# Patient Record
Sex: Female | Born: 1990
Health system: Southern US, Community
[De-identification: ages and names within clinical notes are randomized; demographics above are authoritative.]

## PROBLEM LIST (undated history)

## (undated) ENCOUNTER — Inpatient Hospital Stay (HOSPITAL_COMMUNITY): Payer: Self-pay

## (undated) DIAGNOSIS — L989 Disorder of the skin and subcutaneous tissue, unspecified: Secondary | ICD-10-CM

## (undated) DIAGNOSIS — N9089 Other specified noninflammatory disorders of vulva and perineum: Secondary | ICD-10-CM

## (undated) DIAGNOSIS — Z789 Other specified health status: Secondary | ICD-10-CM

## (undated) DIAGNOSIS — O26649 Intrahepatic cholestasis of pregnancy, unspecified trimester: Secondary | ICD-10-CM

## (undated) DIAGNOSIS — D649 Anemia, unspecified: Secondary | ICD-10-CM

## (undated) DIAGNOSIS — F419 Anxiety disorder, unspecified: Secondary | ICD-10-CM

## (undated) DIAGNOSIS — Z3490 Encounter for supervision of normal pregnancy, unspecified, unspecified trimester: Secondary | ICD-10-CM

## (undated) DIAGNOSIS — O26619 Liver and biliary tract disorders in pregnancy, unspecified trimester: Secondary | ICD-10-CM

## (undated) DIAGNOSIS — M6208 Separation of muscle (nontraumatic), other site: Secondary | ICD-10-CM

## (undated) DIAGNOSIS — K831 Obstruction of bile duct: Secondary | ICD-10-CM

## (undated) DIAGNOSIS — N83209 Unspecified ovarian cyst, unspecified side: Secondary | ICD-10-CM

## (undated) HISTORY — DX: Obstruction of bile duct: K83.1

## (undated) HISTORY — DX: Anemia, unspecified: D64.9

## (undated) HISTORY — PX: WISDOM TOOTH EXTRACTION: SHX21

## (undated) HISTORY — PX: BREAST SURGERY: SHX581

## (undated) HISTORY — DX: Intrahepatic cholestasis of pregnancy, unspecified trimester: O26.649

## (undated) HISTORY — DX: Obstruction of bile duct: O26.619

## (undated) HISTORY — DX: Anxiety disorder, unspecified: F41.9

---
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
## 2013-09-20 NOTE — L&D Delivery Note (Signed)
Delivery Note At 8:24 PM a viable and healthy female was delivered via  (Presentation: ; Occiput Anterior).  APGAR: 9, 9; weight P.   Placenta status: Intact, Spontaneous.  Cord: 3 vessels with the following complications: None.    Anesthesia: Epidural  Episiotomy: None Lacerations: Labial Suture Repair: 3.0 vicryl rapide Est. Blood Loss (mL): 400  Mom to postpartum.  Baby to Couplet care / Skin to Skin.  Bovard-Stuckert, Byan Poplaski 04/19/2014, 9:06 PM  Br/AB+/Contra ?/RNI/Tdap

## 2013-09-23 ENCOUNTER — Encounter (HOSPITAL_COMMUNITY): Payer: Self-pay | Admitting: *Deleted

## 2013-09-23 ENCOUNTER — Inpatient Hospital Stay (HOSPITAL_COMMUNITY)
Admission: AD | Admit: 2013-09-23 | Discharge: 2013-09-23 | Disposition: A | Discharge status: Home / Self Care | Payer: 59 | Source: Ambulatory Visit | Attending: Obstetrics and Gynecology | Admitting: Obstetrics and Gynecology

## 2013-09-23 DIAGNOSIS — O210 Mild hyperemesis gravidarum: Secondary | ICD-10-CM | POA: Insufficient documentation

## 2013-09-23 DIAGNOSIS — O219 Vomiting of pregnancy, unspecified: Secondary | ICD-10-CM

## 2013-09-23 LAB — URINE MICROSCOPIC-ADD ON

## 2013-09-23 LAB — URINALYSIS, ROUTINE W REFLEX MICROSCOPIC
Glucose, UA: NEGATIVE mg/dL
Ketones, ur: >80 mg/dL — AB
Leukocytes, UA: NEGATIVE
Nitrite: NEGATIVE
Protein, ur: 30 mg/dL — AB
Specific Gravity, Urine: >1.03 — ABNORMAL HIGH (ref 1.005–1.030)
Urobilinogen, UA: 0.2 mg/dL (ref 0.0–1.0)
pH: 6 (ref 5.0–8.0)

## 2013-09-23 LAB — POCT PREGNANCY, URINE: Preg Test, Ur: POSITIVE — AB

## 2013-09-23 MED ORDER — DOCUSATE SODIUM 100 MG PO CAPS: 100.0000 mg | ORAL_CAPSULE | Freq: Every day | ORAL | Status: DC | PRN

## 2013-09-23 MED ORDER — LACTATED RINGERS IV BOLUS (SEPSIS): 1000.0000 mL | Freq: Once | INTRAVENOUS | Status: DC

## 2013-09-23 MED ORDER — PROMETHAZINE HCL 25 MG RE SUPP: 25.0000 mg | Freq: Four times a day (QID) | RECTAL | Status: DC | PRN

## 2013-09-23 MED ORDER — ONDANSETRON 8 MG/NS 50 ML IVPB
8.0000 mg | Freq: Once | INTRAVENOUS | Status: AC
  Administered 2013-09-23: 8 mg via INTRAVENOUS
  Filled 2013-09-23: qty 8

## 2013-09-23 MED ORDER — DEXTROSE IN LACTATED RINGERS 5 % IV SOLN
25.0000 mg | Freq: Once | INTRAVENOUS | Status: AC
  Administered 2013-09-23: 25 mg via INTRAVENOUS
  Filled 2013-09-23: qty 1

## 2013-09-23 MED ORDER — PROMETHAZINE HCL 25 MG PO TABS: 25.0000 mg | ORAL_TABLET | Freq: Four times a day (QID) | ORAL | Status: DC | PRN

## 2013-09-23 NOTE — MAU Provider Note (Signed)
Chief Complaint: Morning Sickness  First Provider Initiated Contact with Patient 09/23/13 1452     SUBJECTIVE HPI: Shari [name] is a 23 y.o. gravida 1 para 0 at 8 weeks and 3 days by last menstrual period who presents to maternity admissions reporting not being able to keep anything down x 3 days. Vomits any time she eats or drinks anything and also soon after bowel movements. Has had nausea and vomiting since the beginning of pregnancy. Symptoms were initially well controlled with Zofran, but then it stopped working. She was switched to Diclegis, but has had no relief of symptoms.  Prepregnancy weight 115 pounds per home scale.  History reviewed. No pertinent past medical history. OB History  Gravida Para Term Preterm AB SAB TAB Ectopic Multiple Living  1             # Outcome Date GA Lbr Len/2nd Weight Sex Delivery Anes PTL Lv  1 CUR              Past Surgical History  Procedure Laterality Date  . Wisdom tooth extraction     History   Social History  . Marital Status: Married    Spouse Name: N/A    Number of Children: N/A  . Years of Education: N/A   Occupational History  . Not on file.   Social History Main Topics  . Smoking status: Never Smoker   . Smokeless tobacco: Never Used  . Alcohol Use: No  . Drug Use: No  . Sexual Activity: Yes    Birth Control/ Protection: None   Other Topics Concern  . Not on file   Social History Narrative  . No narrative on file   No current facility-administered medications on file prior to encounter.   No current outpatient prescriptions on file prior to encounter.   No Known Allergies  ROS: Pertinent positive items in HPI. Negative for fever, chills, urinary complaints, flank pain, constipation, diarrhea, vaginal bleeding, abdominal pain, vaginal discharge or sick contacts. Last bowel movement 2 days ago, but has not eaten since then.  OBJECTIVE Blood pressure 125/85, pulse 85, temperature 98.3 F (36.8 C), resp. rate 16,  height 5\' 2"  (1.575 m), weight 47.809 kg (105 lb 6.4 oz), last menstrual period 07/26/2013. GENERAL: Well-developed, well-nourished female in no acute distress. Lethargic-appearing. HEENT: Normocephalic. Mucous membranes dry. HEART: normal rate RESP: normal effort ABDOMEN: Soft, non-tender EXTREMITIES: Nontender, no edema NEURO: Alert and oriented SPECULUM EXAM: Deferred  LAB RESULTS Results for orders placed during the hospital encounter of 09/23/13 (from the past 24 hour(s))  URINALYSIS, ROUTINE W REFLEX MICROSCOPIC     Status: Abnormal   Collection Time    09/23/13  2:20 PM      Result Value Range   Color, Urine YELLOW  YELLOW   APPearance HAZY (*) CLEAR   Specific Gravity, Urine >1.030 (*) 1.005 - 1.030   pH 6.0  5.0 - 8.0   Glucose, UA NEGATIVE  NEGATIVE mg/dL   Hgb urine dipstick TRACE (*) NEGATIVE   Bilirubin Urine SMALL (*) NEGATIVE   Ketones, ur >80 (*) NEGATIVE mg/dL   Protein, ur 30 (*) NEGATIVE mg/dL   Urobilinogen, UA 0.2  0.0 - 1.0 mg/dL   Nitrite NEGATIVE  NEGATIVE   Leukocytes, UA NEGATIVE  NEGATIVE  URINE MICROSCOPIC-ADD ON     Status: Abnormal   Collection Time    09/23/13  2:20 PM      Result Value Range   Squamous Epithelial / LPF MANY (*)  RARE   WBC, UA 3-6  <3 WBC/hpf   RBC / HPF 3-6  <3 RBC/hpf   Bacteria, UA MANY (*) RARE  POCT PREGNANCY, URINE     Status: Abnormal   Collection Time    09/23/13  2:28 PM      Result Value Range   Preg Test, Ur POSITIVE (*) NEGATIVE    IMAGING No results found.  MAU COURSE Phenergan, D5LR bolus. Dr. Senaida Ores notified patient symptoms and Phenergan and fluid orders.  No further vomiting after Phenergan, but nausea continues. Drank small amount of ginger ale and felt worse. Zofran ordered.  Feeling much better after 3 L of fluid and medications. Able to keep down ginger ale and crackers.  ASSESSMENT 1. Nausea and vomiting in pregnancy prior to [redacted] weeks gestation    PLAN Discharge home in stable  condition. Advance diet slowly. Try taking Phenergan at night and Zofran during the day on schedule for now.     Follow-up Information   Follow up with [facility] On 10/05/2013. (As scheduled or as needed if symptoms worsen)    Contact information:   [address], [address] [place] [address] [phone]      Follow up with [facility] [place] [facility]. (As needed if symptoms worsen)    Contact information:   [address] [id number] [place] [address] [phone]       Medication List    STOP taking these medications       DICLEGIS PO      TAKE these medications       docusate sodium 100 MG capsule  Commonly known as:  COLACE  Take 1-2 capsules (100-200 mg total) by mouth daily as needed.     prenatal multivitamin Tabs tablet  Take 1 tablet by mouth daily at 12 noon.     promethazine 25 MG tablet  Commonly known as:  PHENERGAN  Take 1 tablet (25 mg total) by mouth every 6 (six) hours as needed for nausea or vomiting.     promethazine 25 MG suppository  Commonly known as:  PHENERGAN  Place 1 suppository (25 mg total) rectally every 6 (six) hours as needed for nausea or vomiting. May use vaginally.     ZOFRAN 8 MG tablet  Generic drug:  ondansetron  Take 1 tablet (8 mg total) by mouth every 8 (eight) hours as needed for nausea or vomiting.       [place], [place] 09/23/2013  7:34 PM

## 2013-09-23 NOTE — Discharge Instructions (Signed)
Hyperemesis Gravidarum °Hyperemesis gravidarum is a severe form of nausea and vomiting that happens during pregnancy. Hyperemesis is worse than morning sickness. It may cause a woman to have nausea or vomiting all day for many days. It may keep a woman from eating and drinking enough food and liquids. Hyperemesis usually occurs during the first half (the first 20 weeks) of pregnancy. It often goes away once a woman is in her second half of pregnancy. However, sometimes hyperemesis continues through an entire pregnancy.  °CAUSES  °The cause of this condition is not completely known but is thought to be due to changes in the body's hormones when pregnant. It could be the high level of the pregnancy hormone or an increase in estrogen in the body.  °SYMPTOMS  °· Severe nausea and vomiting. °· Nausea that does not go away. °· Vomiting that does not allow you to keep any food down. °· Weight loss and body fluid loss (dehydration). °· Having no desire to eat or not liking food you have previously enjoyed. °DIAGNOSIS  °Your caregiver may ask you about your symptoms. Your caregiver may also order blood tests and urine tests to make sure something else is not causing the problem.  °TREATMENT  °You may only need medicine to control the problem. If medicines do not control the nausea and vomiting, you will be treated in the hospital to prevent dehydration, acidosis, weight loss, and changes in the electrolytes in your body that may harm the unborn baby (fetus). You may need intravenous (IV) fluids.  °HOME CARE INSTRUCTIONS  °· Take all medicine as directed by your caregiver. °· Try eating a couple of dry crackers or toast in the morning before getting out of bed. °· Avoid foods and smells that upset your stomach. °· Avoid fatty and spicy foods. Eat 5 to 6 small meals a day. °· Do not drink when eating meals. Drink between meals. °· For snacks, eat high protein foods, such as cheese. Eat or suck on things that have ginger in  them. Ginger helps nausea. °· Avoid food preparation. The smell of food can spoil your appetite. °· Avoid iron pills and iron in your multivitamins until after 3 to 4 months of being pregnant. °SEEK MEDICAL CARE IF:  °· Your abdominal pain increases since the last time you saw your caregiver. °· You have a severe headache. °· You develop vision problems. °· You feel you are losing weight. °SEEK IMMEDIATE MEDICAL CARE IF:  °· You are unable to keep fluids down. °· You vomit blood. °· You have constant nausea and vomiting. °· You have a fever. °· You have excessive weakness, dizziness, fainting, or extreme thirst. °MAKE SURE YOU:  °· Understand these instructions. °· Will watch your condition. °· Will get help right away if you are not doing well or get worse. °Document Released: 09/06/2005 Document Revised: 11/29/2011 Document Reviewed: 12/07/2010 °ExitCare® Patient Information ©2014 [employer]. ° °Hyperemesis Gravidarum Diet °Hyperemesis gravidarum is a severe form of morning sickness. It is characterized by frequent and severe vomiting. It happens during the first trimester of pregnancy. It may be caused by the rapid hormone changes that happen during pregnancy. It is associated with a 5% weight loss of pre-pregnancy weight. The hyperemesis diet may be used to lessen symptoms of nausea and vomiting. °EATING GUIDELINES °· Eat 5 to 6 small meals daily instead of 3 large meals. °· Avoid foods with strong smells. °· Avoid drinking 30 minutes before and after meals. °· Avoid fried   or high-fat foods, such as butter and cream sauces. °· Starchy foods are usually well-tolerated, such as cereal, toast, bread, potatoes, pasta, rice, and pretzels. °· Eat crackers before you get out of bed in the morning. °· Avoid spicy foods. °· Ginger may help with nausea. Add ¼ tsp ginger to hot tea or choose ginger tea. °· Continue to take your prenatal vitamins as directed by your caregiver. °SAMPLE MEAL PLAN °Breakfast  °· ½ cup  oatmeal °· 1 slice toast °· 1 tsp heart-healthy margarine °· 1 tsp jelly °· 1 scrambled egg °Midmorning Snack  °· 1 cup low-fat yogurt °Lunch  °· Plain ham sandwich °· Carrot or celery sticks °· 1 small apple °· 3 graham crackers °Midafternoon Snack  °· Cheese and crackers °Dinner °· 4 oz pork tenderloin °· 1 small baked potato °· 1 tsp margarine °· ½ cup broccoli °· ½ cup grapes °Evening Snack °· 1 cup pudding °Document Released: 07/04/2007 Document Revised: 11/29/2011 Document Reviewed: 02/06/2013 °ExitCare® Patient Information ©2014 [employer]. °

## 2013-09-23 NOTE — MAU Note (Signed)
Pt presents with complaints of nausea and vomiting on and off since she was [redacted] weeks pregnant. She was given zofran and it worked for a little while and was changed to diclegis on new years eve and it isn's working. She states she is not able to keep anything down by mouth

## 2013-09-24 LAB — URINE CULTURE
Colony Count: NO GROWTH
Culture: NO GROWTH

## 2013-10-08 LAB — OB RESULTS CONSOLE RPR: RPR: NONREACTIVE

## 2013-10-08 LAB — OB RESULTS CONSOLE HIV ANTIBODY (ROUTINE TESTING): HIV: NONREACTIVE

## 2013-10-08 LAB — OB RESULTS CONSOLE ABO/RH: RH TYPE: POSITIVE

## 2013-10-08 LAB — OB RESULTS CONSOLE ANTIBODY SCREEN: Antibody Screen: NEGATIVE

## 2013-10-08 LAB — OB RESULTS CONSOLE GC/CHLAMYDIA
Chlamydia: NEGATIVE
Gonorrhea: NEGATIVE

## 2013-10-08 LAB — OB RESULTS CONSOLE RUBELLA ANTIBODY, IGM: Rubella: NON-IMMUNE/NOT IMMUNE

## 2013-10-08 LAB — OB RESULTS CONSOLE HEPATITIS B SURFACE ANTIGEN: Hepatitis B Surface Ag: NEGATIVE

## 2014-03-28 ENCOUNTER — Inpatient Hospital Stay (HOSPITAL_COMMUNITY)
Admission: AD | Admit: 2014-03-28 | Discharge: 2014-03-28 | Disposition: A | Discharge status: Home / Self Care | Payer: 59 | Source: Ambulatory Visit | Attending: Obstetrics and Gynecology | Admitting: Obstetrics and Gynecology

## 2014-03-28 ENCOUNTER — Encounter (HOSPITAL_COMMUNITY): Payer: Self-pay | Admitting: *Deleted

## 2014-03-28 DIAGNOSIS — O4700 False labor before 37 completed weeks of gestation, unspecified trimester: Secondary | ICD-10-CM | POA: Insufficient documentation

## 2014-03-28 DIAGNOSIS — O9989 Other specified diseases and conditions complicating pregnancy, childbirth and the puerperium: Principal | ICD-10-CM

## 2014-03-28 DIAGNOSIS — O99891 Other specified diseases and conditions complicating pregnancy: Secondary | ICD-10-CM | POA: Insufficient documentation

## 2014-03-28 HISTORY — DX: Other specified health status: Z78.9

## 2014-03-28 LAB — WET PREP, GENITAL
TRICH WET PREP: NONE SEEN
YEAST WET PREP: NONE SEEN

## 2014-03-28 LAB — AMNISURE RUPTURE OF MEMBRANE (ROM) NOT AT ARMC: Amnisure ROM: NEGATIVE

## 2014-03-28 NOTE — MAU Provider Note (Signed)
History     CSN: [id number]  Arrival date and time: 03/28/14 0507   First Provider Initiated Contact with Patient 03/28/14 [phone]      Chief Complaint  Patient presents with  . Rupture of Membranes   HPI  Shari [name] is a 23 y.o. G1P0 at [redacted]w[redacted]d who presents today with possible ROM. She states that around 0400 she woke up and noticed a small trickle of fluid after using the bathroom. She denies any complications with the pregnancy. She denies any VB and states that she does not feel any contractions at this time. She does report occasional cramping, but it is very infrequent and not painful.   Past Medical History  Diagnosis Date  . Medical history non-contributory     Past Surgical History  Procedure Laterality Date  . Wisdom tooth extraction      No family history on file.  History  Substance Use Topics  . Smoking status: Never Smoker   . Smokeless tobacco: Never Used  . Alcohol Use: No    Allergies: No Known Allergies  Prescriptions prior to admission  Medication Sig Dispense Refill  . Prenatal Vit-Fe Fumarate-FA (PRENATAL MULTIVITAMIN) TABS tablet Take 1 tablet by mouth daily at 12 noon.      . docusate sodium (COLACE) 100 MG capsule Take 1-2 capsules (100-200 mg total) by mouth daily as needed.  30 capsule  4  . ondansetron (ZOFRAN) 8 MG tablet Take 1 tablet (8 mg total) by mouth every 8 (eight) hours as needed for nausea or vomiting.  30 tablet  1  . promethazine (PHENERGAN) 25 MG suppository Place 1 suppository (25 mg total) rectally every 6 (six) hours as needed for nausea or vomiting. May use vaginally.  12 each  1  . promethazine (PHENERGAN) 25 MG tablet Take 1 tablet (25 mg total) by mouth every 6 (six) hours as needed for nausea or vomiting.  30 tablet  1    ROS Physical Exam   Last menstrual period 07/26/2013.  Physical Exam  Nursing note and vitals reviewed. Constitutional: She is oriented to person, place, and time. She appears well-developed and  well-nourished. No distress.  Cardiovascular: Normal rate.   Respiratory: Effort normal.  GI: Soft. There is no tenderness.  Genitourinary:   External: no lesion Vagina: small amount of white discharge. No pooling of fluid  Cervix: pink, smooth, closed/thick/high  Uterus: AGA    Neurological: She is alert and oriented to person, place, and time.  Skin: Skin is warm and dry.  Psychiatric: She has a normal mood and affect.   FHT 145, moderate with 15x15 accels, no decels Toco: q3-4 min contractions, patient states that she does not feel them.  MAU Course  Procedures  Results for orders placed during the hospital encounter of 03/28/14 (from the past 24 hour(s))  AMNISURE RUPTURE OF MEMBRANE (ROM)     Status: None   Collection Time    03/28/14  5:52 AM      Result Value Ref Range   Amnisure ROM NEGATIVE    WET PREP, GENITAL     Status: Abnormal   Collection Time    03/28/14  5:52 AM      Result Value Ref Range   Yeast Wet Prep HPF POC NONE SEEN  NONE SEEN   Trich, Wet Prep NONE SEEN  NONE SEEN   Clue Cells Wet Prep HPF POC FEW (*) NONE SEEN   WBC, Wet Prep HPF POC FEW (*) NONE SEEN  Assessment and Plan  Exam for ROM with ROM not found DC home Labor precautions Fetal kick counts Return to MAU as needed  FU with the office as planned      Tawnya CrookHogan, Heather Donovan 03/28/2014, 5:51 AM

## 2014-03-28 NOTE — MAU Note (Signed)
Pt reports gush of clear fluid at 0400, also having some pressure and some cramping

## 2014-04-18 ENCOUNTER — Encounter (HOSPITAL_COMMUNITY): Payer: Self-pay | Admitting: *Deleted

## 2014-04-18 ENCOUNTER — Encounter (HOSPITAL_COMMUNITY): Payer: Self-pay

## 2014-04-18 ENCOUNTER — Telehealth (HOSPITAL_COMMUNITY): Payer: Self-pay | Admitting: *Deleted

## 2014-04-18 DIAGNOSIS — Z3490 Encounter for supervision of normal pregnancy, unspecified, unspecified trimester: Secondary | ICD-10-CM

## 2014-04-18 HISTORY — DX: Encounter for supervision of normal pregnancy, unspecified, unspecified trimester: Z34.90

## 2014-04-18 LAB — OB RESULTS CONSOLE GBS: GBS: NEGATIVE

## 2014-04-18 NOTE — H&P (Signed)
Shari [name] is a 23 y.o. female G1P0 at 39+ for IOL given term status, favorable cervic, maternal discomfort.  Relatively uncomplicated PNC except dated by [facility] US and morning sickness.  +FM, no LOF, no VB, occ ctx.  D/W pt POC for IOL. Maternal Medical History:  Contractions: Frequency: irregular.    Fetal activity: Perceived fetal activity is normal.    Prenatal Complications - Diabetes: none.    OB History   Grav Para Term Preterm Abortions TAB SAB Ect Mult Living   1             G1 present No abn pap No STD  Past Medical History  Diagnosis Date  . Medical history non-contributory   . Normal pregnancy 04/18/2014   Past Surgical History  Procedure Laterality Date  . Wisdom tooth extraction     Family History: fHTN, CAD Social History:  reports that she has never smoked. She has never used smokeless tobacco. She reports that she does not drink alcohol or use illicit drugs.married RN Meds PNV All NKDA   Prenatal Transfer Tool  Maternal Diabetes: No Genetic Screening: Normal Maternal Ultrasounds/Referrals: Normal Fetal Ultrasounds or other Referrals:  None Maternal Substance Abuse:  No Significant Maternal Medications:  None Significant Maternal Lab Results:  Lab values include: Group B Strep negative Other Comments:  None  Review of Systems  Constitutional: Negative.   HENT: Negative.   Eyes: Negative.   Respiratory: Negative.   Cardiovascular: Negative.   Gastrointestinal: Negative.   Genitourinary: Negative.   Musculoskeletal: Positive for back pain.  Skin: Negative.   Neurological: Negative.   Psychiatric/Behavioral: Negative.       Last menstrual period 07/26/2013. Maternal Exam:  Abdomen: Fundal height is appropriate for gestation.   Estimated fetal weight is 7-8#.   Fetal presentation: vertex  Introitus: Normal vulva. Normal vagina.  Pelvis: adequate for delivery.   Cervix: Cervix evaluated by digital exam.     Physical Exam   Constitutional: She is oriented to person, place, and time. She appears well-developed and well-nourished.  HENT:  Head: Normocephalic and atraumatic.  Cardiovascular: Normal rate and regular rhythm.   Respiratory: Effort normal and breath sounds normal. No respiratory distress. She has no wheezes.  GI: Soft. Bowel sounds are normal. She exhibits no distension. There is no tenderness.  Musculoskeletal: Normal range of motion.  Neurological: She is alert and oriented to person, place, and time.  Skin: Skin is warm and dry.  Psychiatric: She has a normal mood and affect. Her behavior is normal.    Prenatal labs: ABO, Rh: AB/Positive/-- (01/19 0000) Antibody: Negative (01/19 0000) Rubella: Nonimmune (01/19 0000) RPR: Nonreactive (01/19 0000)  HBsAg: Negative (01/19 0000)  HIV: Non-reactive (01/19 0000)  GBS: Negative (07/30 0000)   Hgb 13.4/Ur Cx neg/GC neg/ Chl neg/CF neg/glucola 110/[facility] and AFP WNL/Plt 312K/ Tdap 01/29/14 Dated by [facility] US Nl NT Nl anat, post plac, female  Assessment/Plan: 23yo G1P0 at 39 wk for IOL gbbs neg - no prophylaxis AROM/pitocin Expect SVD Epidural PRN   Bovard-Stuckert, Jeffey Janssen 04/18/2014, 8:47 PM

## 2014-04-18 NOTE — Telephone Encounter (Signed)
Preadmission screen  

## 2014-04-19 ENCOUNTER — Inpatient Hospital Stay (HOSPITAL_COMMUNITY)
Admission: RE | Admit: 2014-04-19 | Discharge: 2014-04-21 | DRG: 775 | Disposition: A | Discharge status: Home / Self Care | Payer: 59 | Source: Ambulatory Visit | Attending: Obstetrics and Gynecology | Admitting: Obstetrics and Gynecology

## 2014-04-19 ENCOUNTER — Inpatient Hospital Stay (HOSPITAL_COMMUNITY): Payer: 59 | Admitting: Anesthesiology

## 2014-04-19 ENCOUNTER — Encounter (HOSPITAL_COMMUNITY): Payer: 59 | Admitting: Anesthesiology

## 2014-04-19 ENCOUNTER — Encounter (HOSPITAL_COMMUNITY): Payer: Self-pay

## 2014-04-19 VITALS — BP 102/61 | HR 73 | Temp 98.0°F | Resp 18 | Ht 62.0 in | Wt 155.0 lb

## 2014-04-19 DIAGNOSIS — O9903 Anemia complicating the puerperium: Secondary | ICD-10-CM | POA: Diagnosis present

## 2014-04-19 DIAGNOSIS — O219 Vomiting of pregnancy, unspecified: Secondary | ICD-10-CM

## 2014-04-19 DIAGNOSIS — D500 Iron deficiency anemia secondary to blood loss (chronic): Secondary | ICD-10-CM | POA: Diagnosis present

## 2014-04-19 DIAGNOSIS — Z3490 Encounter for supervision of normal pregnancy, unspecified, unspecified trimester: Secondary | ICD-10-CM

## 2014-04-19 DIAGNOSIS — Z3493 Encounter for supervision of normal pregnancy, unspecified, third trimester: Secondary | ICD-10-CM

## 2014-04-19 DIAGNOSIS — Z3400 Encounter for supervision of normal first pregnancy, unspecified trimester: Secondary | ICD-10-CM

## 2014-04-19 DIAGNOSIS — Z8249 Family history of ischemic heart disease and other diseases of the circulatory system: Secondary | ICD-10-CM

## 2014-04-19 HISTORY — DX: Encounter for supervision of normal pregnancy, unspecified, unspecified trimester: Z34.90

## 2014-04-19 LAB — CBC
HCT: 30.9 % — ABNORMAL LOW (ref 36.0–46.0)
Hemoglobin: 10 g/dL — ABNORMAL LOW (ref 12.0–15.0)
MCH: 29.5 pg (ref 26.0–34.0)
MCHC: 32.4 g/dL (ref 30.0–36.0)
MCV: 91.2 fL (ref 78.0–100.0)
PLATELETS: 238 10*3/uL (ref 150–400)
RBC: 3.39 MIL/uL — ABNORMAL LOW (ref 3.87–5.11)
RDW: 14.6 % (ref 11.5–15.5)
WBC: 8.7 10*3/uL (ref 4.0–10.5)

## 2014-04-19 LAB — RPR

## 2014-04-19 MED ORDER — ZOLPIDEM TARTRATE 5 MG PO TABS: 5.0000 mg | ORAL_TABLET | Freq: Every evening | ORAL | Status: DC | PRN

## 2014-04-19 MED ORDER — EPHEDRINE 5 MG/ML INJ
10.0000 mg | INTRAVENOUS | Status: DC | PRN
  Filled 2014-04-19: qty 2

## 2014-04-19 MED ORDER — OXYTOCIN 40 UNITS IN LACTATED RINGERS INFUSION - SIMPLE MED: 1.0000 m[IU]/min | INTRAVENOUS | Status: DC

## 2014-04-19 MED ORDER — BENZOCAINE-MENTHOL 20-0.5 % EX AERO
1.0000 "application " | INHALATION_SPRAY | CUTANEOUS | Status: DC | PRN
  Administered 2014-04-20: 1 via TOPICAL
  Filled 2014-04-19: qty 56

## 2014-04-19 MED ORDER — LIDOCAINE HCL (PF) 1 % IJ SOLN
INTRAMUSCULAR | Status: DC | PRN
  Administered 2014-04-19 (×2): 5 mL

## 2014-04-19 MED ORDER — FENTANYL 2.5 MCG/ML BUPIVACAINE 1/10 % EPIDURAL INFUSION (WH - ANES)
14.0000 mL/h | INTRAMUSCULAR | Status: DC | PRN
  Administered 2014-04-19 (×3): 14 mL/h via EPIDURAL
  Filled 2014-04-19 (×3): qty 125

## 2014-04-19 MED ORDER — LACTATED RINGERS IV SOLN
INTRAVENOUS | Status: DC
  Administered 2014-04-19: 20:00:00 via INTRAVENOUS

## 2014-04-19 MED ORDER — DIPHENHYDRAMINE HCL 25 MG PO CAPS: 25.0000 mg | ORAL_CAPSULE | Freq: Four times a day (QID) | ORAL | Status: DC | PRN

## 2014-04-19 MED ORDER — BUTORPHANOL TARTRATE 1 MG/ML IJ SOLN: 1.0000 mg | INTRAMUSCULAR | Status: DC | PRN

## 2014-04-19 MED ORDER — LANOLIN HYDROUS EX OINT
TOPICAL_OINTMENT | CUTANEOUS | Status: DC | PRN
Start: 2014-04-19 — End: 2014-04-21

## 2014-04-19 MED ORDER — ONDANSETRON HCL 4 MG PO TABS: 4.0000 mg | ORAL_TABLET | ORAL | Status: DC | PRN

## 2014-04-19 MED ORDER — OXYTOCIN BOLUS FROM INFUSION
500.0000 mL | INTRAVENOUS | Status: DC
  Administered 2014-04-19: 500 mL via INTRAVENOUS

## 2014-04-19 MED ORDER — MEASLES, MUMPS & RUBELLA VAC ~~LOC~~ INJ
0.5000 mL | INJECTION | Freq: Once | SUBCUTANEOUS | Status: AC
  Administered 2014-04-20: 0.5 mL via SUBCUTANEOUS
  Filled 2014-04-19 (×2): qty 0.5

## 2014-04-19 MED ORDER — ONDANSETRON HCL 4 MG/2ML IJ SOLN
4.0000 mg | Freq: Four times a day (QID) | INTRAMUSCULAR | Status: DC | PRN
  Administered 2014-04-19: 4 mg via INTRAVENOUS
  Filled 2014-04-19: qty 2

## 2014-04-19 MED ORDER — WITCH HAZEL-GLYCERIN EX PADS: 1.0000 "application " | MEDICATED_PAD | CUTANEOUS | Status: DC | PRN

## 2014-04-19 MED ORDER — PHENYLEPHRINE 40 MCG/ML (10ML) SYRINGE FOR IV PUSH (FOR BLOOD PRESSURE SUPPORT)
80.0000 ug | PREFILLED_SYRINGE | INTRAVENOUS | Status: DC | PRN
  Filled 2014-04-19: qty 2

## 2014-04-19 MED ORDER — LACTATED RINGERS IV SOLN: 500.0000 mL | INTRAVENOUS | Status: DC | PRN

## 2014-04-19 MED ORDER — SENNOSIDES-DOCUSATE SODIUM 8.6-50 MG PO TABS
2.0000 | ORAL_TABLET | ORAL | Status: DC
  Administered 2014-04-20 – 2014-04-21 (×2): 2 via ORAL
  Filled 2014-04-19 (×2): qty 2

## 2014-04-19 MED ORDER — SIMETHICONE 80 MG PO CHEW: 80.0000 mg | CHEWABLE_TABLET | ORAL | Status: DC | PRN

## 2014-04-19 MED ORDER — OXYTOCIN 40 UNITS IN LACTATED RINGERS INFUSION - SIMPLE MED: 62.5000 mL/h | INTRAVENOUS | Status: DC

## 2014-04-19 MED ORDER — ONDANSETRON HCL 4 MG/2ML IJ SOLN: 4.0000 mg | INTRAMUSCULAR | Status: DC | PRN

## 2014-04-19 MED ORDER — TERBUTALINE SULFATE 1 MG/ML IJ SOLN: 0.2500 mg | Freq: Once | INTRAMUSCULAR | Status: DC | PRN

## 2014-04-19 MED ORDER — ACETAMINOPHEN 325 MG PO TABS: 650.0000 mg | ORAL_TABLET | ORAL | Status: DC | PRN

## 2014-04-19 MED ORDER — CITRIC ACID-SODIUM CITRATE 334-500 MG/5ML PO SOLN: 30.0000 mL | ORAL | Status: DC | PRN

## 2014-04-19 MED ORDER — LIDOCAINE HCL (PF) 1 % IJ SOLN
30.0000 mL | INTRAMUSCULAR | Status: DC | PRN
  Filled 2014-04-19 (×2): qty 30

## 2014-04-19 MED ORDER — IBUPROFEN 600 MG PO TABS: 600.0000 mg | ORAL_TABLET | Freq: Four times a day (QID) | ORAL | Status: DC | PRN

## 2014-04-19 MED ORDER — PHENYLEPHRINE 40 MCG/ML (10ML) SYRINGE FOR IV PUSH (FOR BLOOD PRESSURE SUPPORT)
80.0000 ug | PREFILLED_SYRINGE | INTRAVENOUS | Status: DC | PRN
  Filled 2014-04-19: qty 10
  Filled 2014-04-19: qty 2

## 2014-04-19 MED ORDER — OXYTOCIN 40 UNITS IN LACTATED RINGERS INFUSION - SIMPLE MED
1.0000 m[IU]/min | INTRAVENOUS | Status: DC
  Administered 2014-04-19: 2 m[IU]/min via INTRAVENOUS
  Filled 2014-04-19: qty 1000

## 2014-04-19 MED ORDER — LACTATED RINGERS IV SOLN: INTRAVENOUS | Status: DC

## 2014-04-19 MED ORDER — DIBUCAINE 1 % RE OINT
1.0000 | TOPICAL_OINTMENT | RECTAL | Status: DC | PRN
Start: 2014-04-19 — End: 2014-04-21

## 2014-04-19 MED ORDER — IBUPROFEN 600 MG PO TABS
600.0000 mg | ORAL_TABLET | Freq: Four times a day (QID) | ORAL | Status: DC
  Administered 2014-04-20 – 2014-04-21 (×5): 600 mg via ORAL
  Filled 2014-04-19 (×5): qty 1

## 2014-04-19 MED ORDER — DIPHENHYDRAMINE HCL 50 MG/ML IJ SOLN: 12.5000 mg | INTRAMUSCULAR | Status: DC | PRN

## 2014-04-19 MED ORDER — OXYCODONE-ACETAMINOPHEN 5-325 MG PO TABS: 1.0000 | ORAL_TABLET | ORAL | Status: DC | PRN

## 2014-04-19 MED ORDER — PRENATAL MULTIVITAMIN CH: 1.0000 | ORAL_TABLET | Freq: Every day | ORAL | Status: DC

## 2014-04-19 MED ORDER — LACTATED RINGERS IV SOLN: 500.0000 mL | Freq: Once | INTRAVENOUS | Status: DC

## 2014-04-19 NOTE — Progress Notes (Signed)
Patient ID: Shari [name], female   DOB: 01/28/1991, 23 y.o.   MRN: [id number]  No c/o's  AFVSS gen NAD FHTs 125-130's, good var, category 1 toco q1-652min  SVE 7.8/90/0-+1  23yo G1P0 in active labor Expect SVD

## 2014-04-19 NOTE — Progress Notes (Signed)
Patient ID: Shari [name], female   DOB: 07/17/1991, 23 y.o.   MRN: [id number]  Some pressure  AFVSS gen NAD  FHTs 120-130's, category 1 toco q1-2  SVE 7/100/+2  G1P0 for IOL - expect SVD soon

## 2014-04-19 NOTE — Progress Notes (Signed)
Patient ID: Shari [name], female   DOB: 03/22/1991, 23 y.o.   MRN: [id number] 23yo G1P0 at 39+ for IOL  Some cramping  AFVSS gen NAD FHTs 140's, good var, category 1 toco Q 1-3  AROM for clear fluid, w/o diff/compl.   SVE 4.5/50/-1/2  Cont IOL

## 2014-04-19 NOTE — Anesthesia Procedure Notes (Signed)
Epidural Patient location during procedure: OB Start time: 04/19/2014 9:36 AM  Staffing Anesthesiologist: Brayton CavesJACKSON, Kaelah Hayashi Performed by: anesthesiologist   Preanesthetic Checklist Completed: patient identified, site marked, surgical consent, pre-op evaluation, timeout performed, IV checked, risks and benefits discussed and monitors and equipment checked  Epidural Patient position: sitting Prep: site prepped and draped and DuraPrep Patient monitoring: continuous pulse ox and blood pressure Approach: midline Location: L3-L4 Injection technique: LOR air  Needle:  Needle type: Tuohy  Needle gauge: 17 G Needle length: 9 cm and 9 Needle insertion depth: 5 cm cm Catheter type: closed end flexible Catheter size: 19 Gauge Catheter at skin depth: 10 cm Test dose: negative  Assessment Events: blood not aspirated, injection not painful, no injection resistance, negative IV test and no paresthesia  Additional Notes Patient identified.  Risk benefits discussed including failed block, incomplete pain control, headache, nerve damage, paralysis, blood pressure changes, nausea, vomiting, reactions to medication both toxic or allergic, and postpartum back pain.  Patient expressed understanding and wished to proceed.  All questions were answered.  Sterile technique used throughout procedure and epidural site dressed with sterile barrier dressing. No paresthesia or other complications noted.The patient did not experience any signs of intravascular injection such as tinnitus or metallic taste in mouth nor signs of intrathecal spread such as rapid motor block. Please see nursing notes for vital signs.

## 2014-04-19 NOTE — Anesthesia Preprocedure Evaluation (Signed)

## 2014-04-20 ENCOUNTER — Encounter (HOSPITAL_COMMUNITY): Payer: Self-pay

## 2014-04-20 LAB — CBC
HEMATOCRIT: 23.3 % — AB (ref 36.0–46.0)
HEMOGLOBIN: 7.6 g/dL — AB (ref 12.0–15.0)
MCH: 29.6 pg (ref 26.0–34.0)
MCHC: 32.6 g/dL (ref 30.0–36.0)
MCV: 90.7 fL (ref 78.0–100.0)
Platelets: 197 10*3/uL (ref 150–400)
RBC: 2.57 MIL/uL — ABNORMAL LOW (ref 3.87–5.11)
RDW: 14.6 % (ref 11.5–15.5)
WBC: 16 10*3/uL — ABNORMAL HIGH (ref 4.0–10.5)

## 2014-04-20 NOTE — Lactation Note (Addendum)
This note was copied from the chart of Shari Arnold Longmily Arizpe. Lactation Consultation Note  Patient Name: Shari [name] ZOXWR'UToday's Date: 04/20/2014 Reason for consult: Initial assessment Baby 15 hours of life. Mom has been hand expressing and FOB has fed EBM with spoon. Assisted mom to latch baby to right breast, nipple is inverted, but baby able to evert nipple while suckling. Mom reports mild discomfort when nipple everts. Gave mom comfort gels with instruction. Baby is spitty and had two big burps with fluid in mouth that interrupted nursing. Enc mom to hand express colostrum to entice baby to open wide. Baby nursed well, suckling with a deep latch in the football position for a total of 10 minutes. Baby had to be re-latched due to spitty burps. Enc mom to offer lots of STS and feed with cues. Discussed employee pumps and how to pick up in hospital store. [facility], aware of OP/BFSG and community resources. Enc mom to call out for assistance as needed. Discussed assessment and feeding with patient's Women's RN Kyra.  Maternal Data Has patient been taught Hand Expression?: Yes Does the patient have breastfeeding experience prior to this delivery?: No  Feeding Feeding Type: Breast Fed Length of feed: 10 min  LATCH Score/Interventions Latch: Repeated attempts needed to sustain latch, nipple held in mouth throughout feeding, stimulation needed to elicit sucking reflex.  Audible Swallowing: A few with stimulation Intervention(s): Skin to skin;Hand expression  Type of Nipple: Inverted (right nipple inverted, but baby everts well while suckling.) Intervention(s): No intervention needed  Comfort (Breast/Nipple): Filling, red/small blisters or bruises, mild/mod discomfort  Problem noted: Mild/Moderate discomfort  Hold (Positioning): Assistance needed to correctly position infant at breast and maintain latch. Intervention(s): Breastfeeding basics reviewed;Support Pillows  LATCH  Score: 4  Lactation Tools Discussed/Used     Consult Status Consult Status: Follow-up Date: 04/21/14 Follow-up type: In-patient    Geralynn OchsWILLIARD, Roshelle Traub 04/20/2014, 12:10 PM

## 2014-04-20 NOTE — Progress Notes (Signed)
Post Partum Day 1 Subjective: no complaints, up ad lib, voiding, tolerating PO and nl lochia, pain controlled  Objective: Blood pressure 117/61, pulse 90, temperature 98.8 F (37.1 C), temperature source Oral, resp. rate 18, height 5\' 2"  (1.575 m), weight 70.308 kg (155 lb), last menstrual period 07/26/2013, SpO2 99.00%, unknown if currently breastfeeding.  Physical Exam:  General: alert and no distress Lochia: appropriate Uterine Fundus: firm   Recent Labs  04/19/14 0734 04/20/14 0545  HGB 10.0* 7.6*  HCT 30.9* 23.3*    Assessment/Plan: Plan for discharge tomorrow, Breastfeeding and Lactation consult.  Routine care.     LOS: 1 day   Bovard-Stuckert, Tarrence Enck 04/20/2014, 7:52 AM

## 2014-04-20 NOTE — Plan of Care (Signed)
Problem: Phase I Progression Outcomes Goal: Pain controlled with appropriate interventions Outcome: Completed/Met Date Met:  04/20/14 Has not needed pain medication up to this point,but did get ATC Motrin. Goal: Voiding adequately Outcome: Completed/Met Date Met:  04/20/14 Voided large amt of clear yellow urine. Goal: OOB as tolerated unless otherwise ordered Outcome: Completed/Met Date Met:  04/20/14 Walked to bathroom with assist and tolerated well. Goal: Initial discharge plan identified Outcome: Completed/Met Date Met:  04/20/14 VSS Vaginal bleeding WNL Pain controlled Understands self care and MD follow up

## 2014-04-21 ENCOUNTER — Ambulatory Visit: Payer: Self-pay

## 2014-04-21 LAB — CBC
HCT: 20.1 % — ABNORMAL LOW (ref 36.0–46.0)
Hemoglobin: 6.5 g/dL — CL (ref 12.0–15.0)
MCH: 30 pg (ref 26.0–34.0)
MCHC: 32.3 g/dL (ref 30.0–36.0)
MCV: 92.6 fL (ref 78.0–100.0)
PLATELETS: 182 10*3/uL (ref 150–400)
RBC: 2.17 MIL/uL — ABNORMAL LOW (ref 3.87–5.11)
RDW: 14.7 % (ref 11.5–15.5)
WBC: 13 10*3/uL — ABNORMAL HIGH (ref 4.0–10.5)

## 2014-04-21 MED ORDER — IBUPROFEN 800 MG PO TABS: 800.0000 mg | ORAL_TABLET | Freq: Three times a day (TID) | ORAL | Status: DC | PRN

## 2014-04-21 MED ORDER — POLYETHYLENE GLYCOL 3350 17 G PO PACK: 17.0000 g | PACK | Freq: Every day | ORAL | Status: DC

## 2014-04-21 MED ORDER — PRENATAL MULTIVITAMIN CH: 1.0000 | ORAL_TABLET | Freq: Every day | ORAL | Status: DC

## 2014-04-21 MED ORDER — FERROUS SULFATE 325 (65 FE) MG PO TABS: 325.0000 mg | ORAL_TABLET | Freq: Two times a day (BID) | ORAL | Status: DC

## 2014-04-21 MED ORDER — FERROUS SULFATE 325 (65 FE) MG PO TABS: 325.0000 mg | ORAL_TABLET | Freq: Every day | ORAL | Status: DC

## 2014-04-21 MED ORDER — OXYCODONE-ACETAMINOPHEN 5-325 MG PO TABS: 1.0000 | ORAL_TABLET | Freq: Four times a day (QID) | ORAL | Status: DC | PRN

## 2014-04-21 NOTE — Progress Notes (Signed)
Post Partum Day 2 Subjective: no complaints, up ad lib, voiding, tolerating PO and nl lochia, pain controlled.  Pt asymptomatic with blood loss anemia.  Recc iron  Objective: Blood pressure 102/61, pulse 73, temperature 98 F (36.7 C), temperature source Oral, resp. rate 18, height 5\' 2"  (1.575 m), weight 70.308 kg (155 lb), last menstrual period 07/26/2013, SpO2 99.00%, unknown if currently breastfeeding.  Physical Exam:  General: alert and no distress Lochia: appropriate Uterine Fundus: firm   Recent Labs  04/20/14 0545 04/21/14 0525  HGB 7.6* 6.5*  HCT 23.3* 20.1*    Assessment/Plan: Discharge home, Breastfeeding and Lactation consult.  Routine care.  D/C with motrin, percocet and PNV.  F/u 6 weeks.     LOS: 2 days   Bovard-Stuckert, Kanyon Seibold 04/21/2014, 8:57 AM

## 2014-04-21 NOTE — Progress Notes (Signed)
Discharge instructions provided to patient and significant other at bedside.  Medications, activity, follow up appointments, when to call the doctor and community resources discussed.  No questions at this time.  Patient left unit in stable condition with all personal belongings and prescriptions accompanied by staff.  K. Jeanpaul Biehl, RN------ 

## 2014-04-21 NOTE — Lactation Note (Signed)
This note was copied from the chart of Girl Arnold Longmily Bashore. Lactation Consultation Note Mom request help with breastfeeding due to the right nipple being inverted. Nipple is everted and baby Zoe can easily latch; there is a line of inversion in the center of the nipple which is causing pain.  Nipple shield initiated; Zoe latched easily to NS; mom states much more comfortable. Mom will use NS on the right side but not the left, as the left is everted and pain-free.   Hand pump provided, reviewed its use and cleaning; mom will get her DEBP from [facility] on Tuesday.    Mom was encouraged to call lactation department if she has any questions or concerns and to attend the breastfeeding support group.   Patient Name: Girl Arnold Longmily Hane VHQIO'NToday's Date: 04/21/2014 Reason for consult: Follow-up assessment   Maternal Data    Feeding Feeding Type: Breast Fed Length of feed: 10 min  LATCH Score/Interventions Latch: Grasps breast easily, tongue down, lips flanged, rhythmical sucking.  Audible Swallowing: Spontaneous and intermittent  Type of Nipple: Everted at rest and after stimulation  Comfort (Breast/Nipple): Filling, red/small blisters or bruises, mild/mod discomfort  Problem noted: Mild/Moderate discomfort  Hold (Positioning): No assistance needed to correctly position infant at breast.  LATCH Score: 9  Lactation Tools Discussed/Used     Consult Status Consult Status: Complete    Lenard ForthSanders, Orion Vandervort Fulmer 04/21/2014, 11:38 AM

## 2014-04-21 NOTE — Discharge Summary (Signed)
Obstetric Discharge Summary Reason for Admission: onset of labor Prenatal Procedures: none Intrapartum Procedures: spontaneous vaginal delivery Postpartum Procedures: none Complications-Operative and Postpartum: 2nd degree perineal laceration Hemoglobin  Date Value Ref Range Status  04/21/2014 6.5* 12.0 - 15.0 g/dL Final     REPEATED TO VERIFY     CRITICAL RESULT CALLED TO, READ BACK BY AND VERIFIED WITH:     SPOKE TO SANDERSK @ 0601 ON [id number] BY BOSTONC     HCT  Date Value Ref Range Status  04/21/2014 20.1* 36.0 - 46.0 % Final    Physical Exam:  General: alert and no distress Lochia: appropriate Uterine Fundus: firm  Discharge Diagnoses: Term Pregnancy-delivered  Discharge Information: Date: 04/21/2014 Activity: pelvic rest Diet: routine Medications: PNV, Ibuprofen, Iron and Percocet Condition: stable Instructions: refer to practice specific booklet Discharge to: home Follow-up Information   Follow up with Shari [name], Shari Malta, MD. Schedule an appointment as soon as possible for a visit in 6 weeks. (for postpartum check)    Specialty:  Obstetrics and Gynecology   Contact information:   [address] [place] [id number] [phone]       Newborn Data: Live born female  Birth Weight: 8 lb (3630 g) APGAR: 9, 9  Home with mother.  Shari [name], Shari [name] 04/21/2014, 9:11 AM

## 2014-04-21 NOTE — Anesthesia Postprocedure Evaluation (Signed)
Anesthesia Post Note  Patient: Shari [name]  Procedure(s) Performed: * No procedures listed *  Anesthesia type: Epidural  Patient location: Mother/Baby  Post pain: Pain level controlled  Post assessment: Post-op Vital signs reviewed  Last Vitals:  Filed Vitals:   04/21/14 0536  BP: 102/61  Pulse: 73  Temp: 36.7 C  Resp: 18    Post vital signs: Reviewed  Level of consciousness:alert  Complications: No apparent anesthesia complications

## 2014-04-23 ENCOUNTER — Encounter (HOSPITAL_COMMUNITY): Payer: Self-pay

## 2014-05-03 ENCOUNTER — Ambulatory Visit (HOSPITAL_COMMUNITY)
Admission: RE | Admit: 2014-05-03 | Discharge: 2014-05-03 | Disposition: A | Discharge status: Home / Self Care | Payer: 59 | Source: Ambulatory Visit | Attending: Obstetrics and Gynecology | Admitting: Obstetrics and Gynecology

## 2014-05-03 NOTE — Lactation Note (Signed)
Lactation Consult  Mother's reason for visit: Mother is complaining of a possible clogged duct. She denies having a fever, chills or flu like symptoms. She states that she does have a headache. Mother states the headache comes and goes and she has had it since hospital discharge. She states that Hem was 6.5 on discharge from the hospital. She had a drop from 10 piror to delivery.  Visit Type: feeding assessment  Appointment Notes:  Mother has a reddened area over the (L) breast that is 3-4 cm in size at 3 o clock. Assist mother with good breast massage. Infant was latched on the affected breast (L). Observed that infant drain breast well. Breast massage by LC. Area softened only a little. I had mother to phone OB while in the office. She was able to leave a message to get a call back for possible early mastitis. Mother was given guidelines to massage, breastfeed , post pump . She will follow up in one week for assessment.   Consult:  Initial Lactation Consultant:  Michel Bickers  ________________________________________________________________________  [name] Name: Shari [name]  Date of Birth: 04/19/2016  Pediatrician: Dr Eddie Candle Gender: female  Gestational Age: [redacted]w[redacted]d (At Birth)  Birth Weight: 8 lb (3630 g)  Weight at Discharge: Weight: 7 lb 10.2 oz (3464 g) Date of Discharge: 04/21/2014    [facility] Weights    04/19/14 2024 04/21/14 0100   Weight: 8 lb (3630 g) 7 lb 10.2 oz (3464 g)   Last weight taken from location [facility] of [facility] Weight today:8-2.3,3694            ________________________________________________________________________  Mother's Name: Shari [name] Type of delivery:  vaginal del  Breastfeeding Experience:  none Maternal Medical Conditions:  Post-partum hemorrhage and Gastric bypass Maternal Medications:  Iron supplement and Prenatal vit, Motrin  ________________________________________________________________________  Breastfeeding History  (Post Discharge)  Frequency of breastfeeding: every 2-3 hours Duration of feeding: 30-40 mins    Pumping  Type of pump:  Medela free style Frequency:  3 times Volume:  60 ml  Infant Intake and Output Assessment  Voids:  8 in 24 hrs.  Color:  Clear yellow Stools:  6in 24 hrs.  Color:  Yellow  ________________________________________________________________________  Maternal Breast Assessment  Breast:  Full Nipple:  Erect Pain level:  Pain scale of 4-5, area tender to touch Pain interventions:  Bra  _______________________________________________________________________  Infant's oral assessment:  WNL  Positioning:  Football Left breast  LATCH documentation:  Latch:  2 = Grasps breast easily, tongue down, lips flanged, rhythmical sucking.  Audible swallowing:  2 = Spontaneous and intermittent  Type of nipple:  2 = Everted at rest and after stimulation  Comfort (Breast/Nipple):  1 = Filling, red/small blisters or bruises, mild/mod discomfort  Hold (Positioning):  2 = No assistance needed to correctly position infant at breast  [facility] score:9  Attached assessment:  Deep  Lips flanged:  Yes.    Lips untucked:  Yes.    Suck assessment:  Nutritive   Pre-feed weight: 3694 g  (8 lb. 2.3 oz.) Post-feed weight: 3732 g (8 lb. 3.6 oz.) Amount transferred:  38ml   Infant's oral assessment:  WNL  Positioning:  Football Right breast  LATCH documentation:  Latch:  2 = Grasps breast easily, tongue down, lips flanged, rhythmical sucking.  Audible swallowing:  2 = Spontaneous and intermittent  Type of nipple:  2 = Everted at rest and after stimulation  Comfort (Breast/Nipple):  1 = Filling, red/small blisters or  bruises, mild/mod discomfort  Hold (Positioning):  2 = No assistance needed to correctly position infant at breast  LATCH score: 9  Attached assessment:  Deep  Lips flanged:  Yes.    Lips untucked:  Yes.     Suck assessment:  nutritive  Pre-feed weight:  3737 g  (8 lb. 3.7 oz.) Post-feed weight: 3764 g (8 lb.4.8 oz.) Amount transferred: 27 ml   Total amount transferred:  65 ml   Continue to cue base feed, wake infant to feed at 3 hours if not cuing Observed that infant opens wide with lips flanged Breast feed infant in football on (L) breast for massage  Advised mother to phone OB to get Rx for antibiotic Recommend that mother follow treatment plan for Mastitis --apply moist warm compresses over affected area every 2-3 hours --take Motrin --drain the breast well with active cue base feeding --do good breast massage over area -- take all prescribed antitibotic --nap frequently when Shari [name] naps -- drink to thirst  Continue to pump for 20 mins at least 2-3 times daily If breast become to full pump for comfort at least 5-10 mins Follow up in one week or see OB if not resolved appt scheduled for August 24 at 1p,m.

## 2014-05-13 ENCOUNTER — Ambulatory Visit (HOSPITAL_COMMUNITY)
Admission: RE | Admit: 2014-05-13 | Discharge: 2014-05-13 | Disposition: A | Discharge status: Home / Self Care | Payer: 59 | Source: Ambulatory Visit | Attending: Obstetrics and Gynecology | Admitting: Obstetrics and Gynecology

## 2014-05-13 NOTE — Lactation Note (Signed)
Lactation Consult  Mother's reason for visit:  Weight check on Shari [name] and f/u for mastitis Visit Type:outpatient lactation Appointment Notes:  Baby with slow weight gain at first, doing well now, mom with history plugged duct/mastitis, on last day of antibiotics Consult:  Follow-Up Lactation Consultant:  Alfred Levins  ________________________________________________________________________    Shari [name] Name: Shari [name]  Date of Birth: 04/19/2016  Pediatrician: Dr. Loraine Leriche cummings  Gender: female  Gestational Age: [redacted]w[redacted]d (At Birth)  Birth Weight: 8 lb (3630 g)  Weight at Discharge: Weight: 7 lb 10.2 oz (3464 g) Date of Discharge: 04/21/2014     [facility] Weights     04/19/14 2024 04/21/14 0100    Weight: 8 lb (3630 g) 7 lb 10.2 oz (3464 g)    [facility]: 8/17 Dr. Eddie Candle  8 lbs 6 oz Location: Weight today: 8 lbs 15.6        ________________________________________________________________________  Mother's Name: Arnold Long Type of delivery:  vaginal Breastfeeding Experience:  First baby Maternal Medical Conditions:  None pertinent Maternal Medications:  Ibuprofen 800 mg TID, dicloxacillin 500 mg TID, last dose this afternoon, iron PD, prenatal vitamins  ________________________________________________________________________  Breastfeeding History (Post Discharge)  Frequency of breastfeeding:  On demand, every 2-3 hours Duration of feeding:  30-60 minutes    Pumping  Type of pump:  medla free style Frequency:  Twice a day Volume:  Drops - 15  mls after breast feeding, 90 mls on full breastl  Infant Intake and Output Assessment  Voids:  6-8 in 24 hrs.  Color:  Clear yellow Stools:  4-6 in 24 hrs.  Color:  Yellow  ________________________________________________________________________  Maternal Breast Assessment  Breast:  Soft, Full and hard knot on left breast, at 2 o'clock Nipple:  Erect Pain level:  0 Pain  interventions:  none  _______________________________________________________________________ Feeding Assessment/Evaluation  Shari [name] now 75 weeks old, and is 1 pound over birth weight. Mom has been feeding her on demand, and she feeds every 2-3 hours. Shari [name] transferred 34 mls in two latches, and is acting hungry again now. She tends to get sleepy at the breast, and feeds on and off over an hour at times. Her weight gains is very good, 10 ounces in the last 7 days. Mom still has a hard knot in her left breast, but no evidence of infection. Mom takes her last antibiotic today, a 10 day course. Mom also has mild cracking in her right nipple, this nipple was inverted, and Shari [name] has everted it. I suggested mom call her MD, and see if she will order all purpose nipple cream on  this nipple. Mom is to feed on cue, pump left breast only if Shari [name] goes longer than 3 hours without feeding, and pump right breast only if uncomfortably full, and mom can not get Shari [name] to feed. Continue with  warm compresses and heat to left knot in breast.  Mom knows to call as needed.  Initial feeding assessment:  Infant's oral assessment:  WNL  Positioning:  Cradle Left breast  LATCH documentation:  Latch:  2 = Grasps breast easily, tongue down, lips flanged, rhythmical sucking.  Audible swallowing:  2 = Spontaneous and intermittent  Type of nipple:  2 = Everted at rest and after stimulation  Comfort (Breast/Nipple):  2 = Soft / non-tender  Hold (Positioning):  2 = No assistance needed to correctly position infant at breast  LATCH score:  10  Attached assessment:  Deep  Lips  flanged:  Yes.    Lips untucked:  Yes.    Suck assessment:  Nonnutritive  Tools:  none Instructed on use and cleaning of tool:  no  Pre-feed weight: 4070g  (8 lb. 15.6 oz.) Post-feed weight:4076 g (9 lb. 0.1 oz.) Amount transferred:  16 ml Amount supplemented: 0 ml  Additional Feeding Assessment -   Infant's oral assessment:  WNL  Positioning:   Cradle Left breast  LATCH documentation:  Latch:  2 = Grasps breast easily, tongue down, lips flanged, rhythmical sucking.  Audible swallowing:  2 = Spontaneous and intermittent  Type of nipple:  2 = Everted at rest and after stimulation  Comfort (Breast/Nipple):  2 = Soft / non-tender  Hold (Positioning):  2 = No assistance needed to correctly position infant at breast  LATCH score:  10  Attached assessment:  Deep  Lips flanged:  Yes.    Lips untucked:  Yes.    Suck assessment:  Nutritive  Tools:  none Instructed on use and cleaning of tool:  No.  Pre-feed weight:  4086 g  (9 lb. 0.1 oz.) Post-feed weight:  2104 g (9 lb. 0.7 oz.) Amount transferred:  18 ml Amount supplemented:  0 ml   Total amount pumped post feed:  R not fed from ml    L 34 ml - Mom had breast fed baby 2 hours prior to consult  Total amount transferred:  34 ml Total supplement given:  0 ml

## 2014-05-24 ENCOUNTER — Other Ambulatory Visit: Payer: Self-pay | Admitting: Obstetrics and Gynecology

## 2014-05-24 ENCOUNTER — Ambulatory Visit
Admission: RE | Admit: 2014-05-24 | Discharge: 2014-05-24 | Disposition: A | Discharge status: Home / Self Care | Payer: 59 | Source: Ambulatory Visit | Attending: Obstetrics and Gynecology | Admitting: Obstetrics and Gynecology

## 2014-05-24 DIAGNOSIS — N63 Unspecified lump in unspecified breast: Secondary | ICD-10-CM

## 2014-07-22 ENCOUNTER — Encounter (HOSPITAL_COMMUNITY): Payer: Self-pay

## 2014-10-21 ENCOUNTER — Other Ambulatory Visit: Payer: Self-pay | Admitting: Obstetrics and Gynecology

## 2014-10-21 DIAGNOSIS — N63 Unspecified lump in unspecified breast: Secondary | ICD-10-CM

## 2014-11-22 ENCOUNTER — Ambulatory Visit
Admission: RE | Admit: 2014-11-22 | Discharge: 2014-11-22 | Disposition: A | Discharge status: Home / Self Care | Payer: 59 | Source: Ambulatory Visit | Attending: Obstetrics and Gynecology | Admitting: Obstetrics and Gynecology

## 2014-11-22 ENCOUNTER — Other Ambulatory Visit: Payer: Self-pay | Admitting: Obstetrics and Gynecology

## 2014-11-22 DIAGNOSIS — N63 Unspecified lump in unspecified breast: Secondary | ICD-10-CM

## 2014-12-04 ENCOUNTER — Ambulatory Visit
Admission: RE | Admit: 2014-12-04 | Discharge: 2014-12-04 | Disposition: A | Discharge status: Home / Self Care | Payer: 59 | Source: Ambulatory Visit | Attending: Obstetrics and Gynecology | Admitting: Obstetrics and Gynecology

## 2014-12-04 DIAGNOSIS — N63 Unspecified lump in unspecified breast: Secondary | ICD-10-CM

## 2015-08-25 ENCOUNTER — Telehealth: Payer: Self-pay | Admitting: Family Medicine

## 2015-08-25 ENCOUNTER — Encounter: Payer: Self-pay | Admitting: Family Medicine

## 2015-08-25 ENCOUNTER — Ambulatory Visit (INDEPENDENT_AMBULATORY_CARE_PROVIDER_SITE_OTHER): Payer: 59 | Admitting: Family Medicine

## 2015-08-25 VITALS — BP 124/79 | HR 103 | Temp 98.2°F | Resp 20 | Ht 63.5 in | Wt 117.0 lb

## 2015-08-25 DIAGNOSIS — D649 Anemia, unspecified: Secondary | ICD-10-CM | POA: Diagnosis not present

## 2015-08-25 DIAGNOSIS — L989 Disorder of the skin and subcutaneous tissue, unspecified: Secondary | ICD-10-CM

## 2015-08-25 DIAGNOSIS — Z7689 Persons encountering health services in other specified circumstances: Secondary | ICD-10-CM

## 2015-08-25 DIAGNOSIS — Z0000 Encounter for general adult medical examination without abnormal findings: Secondary | ICD-10-CM | POA: Insufficient documentation

## 2015-08-25 DIAGNOSIS — Z7189 Other specified counseling: Secondary | ICD-10-CM

## 2015-08-25 LAB — CBC WITH DIFFERENTIAL/PLATELET
Basophils Absolute: 0 10*3/uL (ref 0.0–0.1)
Basophils Relative: 0.5 % (ref 0.0–3.0)
EOS PCT: 0.8 % (ref 0.0–5.0)
Eosinophils Absolute: 0 10*3/uL (ref 0.0–0.7)
HCT: 39.8 % (ref 36.0–46.0)
HEMOGLOBIN: 13.4 g/dL (ref 12.0–15.0)
LYMPHS ABS: 1.8 10*3/uL (ref 0.7–4.0)
Lymphocytes Relative: 34.8 % (ref 12.0–46.0)
MCHC: 33.6 g/dL (ref 30.0–36.0)
MCV: 91 fl (ref 78.0–100.0)
Monocytes Absolute: 0.4 10*3/uL (ref 0.1–1.0)
Monocytes Relative: 8.4 % (ref 3.0–12.0)
Neutro Abs: 2.8 10*3/uL (ref 1.4–7.7)
Neutrophils Relative %: 55.5 % (ref 43.0–77.0)
Platelets: 282 10*3/uL (ref 150.0–400.0)
RBC: 4.38 Mil/uL (ref 3.87–5.11)
RDW: 12.6 % (ref 11.5–15.5)
WBC: 5.1 10*3/uL (ref 4.0–10.5)

## 2015-08-25 NOTE — Telephone Encounter (Signed)
Please call pt: - her labs are normal and her hgb is 13.4.

## 2015-08-25 NOTE — Patient Instructions (Signed)
Health Maintenance, Female Adopting a healthy lifestyle and getting preventive care can go a long way to promote health and wellness. Talk with your health care provider about what schedule of regular examinations is right for you. This is a good chance for you to check in with your provider about disease prevention and staying healthy. In between checkups, there are plenty of things you can do on your own. Experts have done a lot of research about which lifestyle changes and preventive measures are most likely to keep you healthy. Ask your health care provider for more information. WEIGHT AND DIET  Eat a healthy diet  Be sure to include plenty of vegetables, fruits, low-fat dairy products, and lean protein.  Do not eat a lot of foods high in solid fats, added sugars, or salt.  Get regular exercise. This is one of the most important things you can do for your health.  Most adults should exercise for at least 150 minutes each week. The exercise should increase your heart rate and make you sweat (moderate-intensity exercise).  Most adults should also do strengthening exercises at least twice a week. This is in addition to the moderate-intensity exercise.  Maintain a healthy weight  Body mass index (BMI) is a measurement that can be used to identify possible weight problems. It estimates body fat based on height and weight. Your health care provider can help determine your BMI and help you achieve or maintain a healthy weight.  For females 20 years of age and older:   A BMI below 18.5 is considered underweight.  A BMI of 18.5 to 24.9 is normal.  A BMI of 25 to 29.9 is considered overweight.  A BMI of 30 and above is considered obese.  Watch levels of cholesterol and blood lipids  You should start having your blood tested for lipids and cholesterol at 26 years of age, then have this test every 5 years.  You may need to have your cholesterol levels checked more often if:  Your lipid  or cholesterol levels are high.  You are older than 26 years of age.  You are at high risk for heart disease.  CANCER SCREENING   Lung Cancer  Lung cancer screening is recommended for adults 55-80 years old who are at high risk for lung cancer because of a history of smoking.  A yearly low-dose CT scan of the lungs is recommended for people who:  Currently smoke.  Have quit within the past 15 years.  Have at least a 30-pack-year history of smoking. A pack year is smoking an average of one pack of cigarettes a day for 1 year.  Yearly screening should continue until it has been 15 years since you quit.  Yearly screening should stop if you develop a health problem that would prevent you from having lung cancer treatment.  Breast Cancer  Practice breast self-awareness. This means understanding how your breasts normally appear and feel.  It also means doing regular breast self-exams. Let your health care provider know about any changes, no matter how small.  If you are in your 20s or 30s, you should have a clinical breast exam (CBE) by a health care provider every 1-3 years as part of a regular health exam.  If you are 40 or older, have a CBE every year. Also consider having a breast X-ray (mammogram) every year.  If you have a family history of breast cancer, talk to your health care provider about genetic screening.  If you   are at high risk for breast cancer, talk to your health care provider about having an MRI and a mammogram every year.  Breast cancer gene (BRCA) assessment is recommended for women who have family members with BRCA-related cancers. BRCA-related cancers include:  Breast.  Ovarian.  Tubal.  Peritoneal cancers.  Results of the assessment will determine the need for genetic counseling and BRCA1 and BRCA2 testing. Cervical Cancer Your health care provider may recommend that you be screened regularly for cancer of the pelvic organs (ovaries, uterus, and  vagina). This screening involves a pelvic examination, including checking for microscopic changes to the surface of your cervix (Pap test). You may be encouraged to have this screening done every 3 years, beginning at age 21.  For women ages 30-65, health care providers may recommend pelvic exams and Pap testing every 3 years, or they may recommend the Pap and pelvic exam, combined with testing for human papilloma virus (HPV), every 5 years. Some types of HPV increase your risk of cervical cancer. Testing for HPV may also be done on women of any age with unclear Pap test results.  Other health care providers may not recommend any screening for nonpregnant women who are considered low risk for pelvic cancer and who do not have symptoms. Ask your health care provider if a screening pelvic exam is right for you.  If you have had past treatment for cervical cancer or a condition that could lead to cancer, you need Pap tests and screening for cancer for at least 20 years after your treatment. If Pap tests have been discontinued, your risk factors (such as having a new sexual partner) need to be reassessed to determine if screening should resume. Some women have medical problems that increase the chance of getting cervical cancer. In these cases, your health care provider may recommend more frequent screening and Pap tests. Colorectal Cancer  This type of cancer can be detected and often prevented.  Routine colorectal cancer screening usually begins at 26 years of age and continues through 26 years of age.  Your health care provider may recommend screening at an earlier age if you have risk factors for colon cancer.  Your health care provider may also recommend using home test kits to check for hidden blood in the stool.  A small camera at the end of a tube can be used to examine your colon directly (sigmoidoscopy or colonoscopy). This is done to check for the earliest forms of colorectal  cancer.  Routine screening usually begins at age 50.  Direct examination of the colon should be repeated every 5-10 years through 26 years of age. However, you may need to be screened more often if early forms of precancerous polyps or small growths are found. Skin Cancer  Check your skin from head to toe regularly.  Tell your health care provider about any new moles or changes in moles, especially if there is a change in a mole's shape or color.  Also tell your health care provider if you have a mole that is larger than the size of a pencil eraser.  Always use sunscreen. Apply sunscreen liberally and repeatedly throughout the day.  Protect yourself by wearing long sleeves, pants, a wide-brimmed hat, and sunglasses whenever you are outside. HEART DISEASE, DIABETES, AND HIGH BLOOD PRESSURE   High blood pressure causes heart disease and increases the risk of stroke. High blood pressure is more likely to develop in:  People who have blood pressure in the high end   of the normal range (130-139/85-89 mm Hg).  People who are overweight or obese.  People who are African American.  If you are 38-23 years of age, have your blood pressure checked every 3-5 years. If you are 61 years of age or older, have your blood pressure checked every year. You should have your blood pressure measured twice--once when you are at a hospital or clinic, and once when you are not at a hospital or clinic. Record the average of the two measurements. To check your blood pressure when you are not at a hospital or clinic, you can use:  An automated blood pressure machine at a pharmacy.  A home blood pressure monitor.  If you are between 45 years and 39 years old, ask your health care provider if you should take aspirin to prevent strokes.  Have regular diabetes screenings. This involves taking a blood sample to check your fasting blood sugar level.  If you are at a normal weight and have a low risk for diabetes,  have this test once every three years after 26 years of age.  If you are overweight and have a high risk for diabetes, consider being tested at a younger age or more often. PREVENTING INFECTION  Hepatitis B  If you have a higher risk for hepatitis B, you should be screened for this virus. You are considered at high risk for hepatitis B if:  You were born in a country where hepatitis B is common. Ask your health care provider which countries are considered high risk.  Your parents were born in a high-risk country, and you have not been immunized against hepatitis B (hepatitis B vaccine).  You have HIV or AIDS.  You use needles to inject street drugs.  You live with someone who has hepatitis B.  You have had sex with someone who has hepatitis B.  You get hemodialysis treatment.  You take certain medicines for conditions, including cancer, organ transplantation, and autoimmune conditions. Hepatitis C  Blood testing is recommended for:  Everyone born from [birth year] through 1967.  Anyone with known risk factors for hepatitis C. Sexually transmitted infections (STIs)  You should be screened for sexually transmitted infections (STIs) including gonorrhea and chlamydia if:  You are sexually active and are younger than 26 years of age.  You are older than 26 years of age and your health care provider tells you that you are at risk for this type of infection.  Your sexual activity has changed since you were last screened and you are at an increased risk for chlamydia or gonorrhea. Ask your health care provider if you are at risk.  If you do not have HIV, but are at risk, it may be recommended that you take a prescription medicine daily to prevent HIV infection. This is called pre-exposure prophylaxis (PrEP). You are considered at risk if:  You are sexually active and do not regularly use condoms or know the HIV status of your partner(s).  You take drugs by injection.  You are sexually  active with a partner who has HIV. Talk with your health care provider about whether you are at high risk of being infected with HIV. If you choose to begin PrEP, you should first be tested for HIV. You should then be tested every 3 months for as long as you are taking PrEP.  PREGNANCY   If you are premenopausal and you may become pregnant, ask your health care provider about preconception counseling.  If you may  become pregnant, take 400 to 800 micrograms (mcg) of folic acid every day.  If you want to prevent pregnancy, talk to your health care provider about birth control (contraception). OSTEOPOROSIS AND MENOPAUSE   Osteoporosis is a disease in which the bones lose minerals and strength with aging. This can result in serious bone fractures. Your risk for osteoporosis can be identified using a bone density scan.  If you are 61 years of age or older, or if you are at risk for osteoporosis and fractures, ask your health care provider if you should be screened.  Ask your health care provider whether you should take a calcium or vitamin D supplement to lower your risk for osteoporosis.  Menopause may have certain physical symptoms and risks.  Hormone replacement therapy may reduce some of these symptoms and risks. Talk to your health care provider about whether hormone replacement therapy is right for you.  HOME CARE INSTRUCTIONS   Schedule regular health, dental, and eye exams.  Stay current with your immunizations.   Do not use any tobacco products including cigarettes, chewing tobacco, or electronic cigarettes.  If you are pregnant, do not drink alcohol.  If you are breastfeeding, limit how much and how often you drink alcohol.  Limit alcohol intake to no more than 1 drink per day for nonpregnant women. One drink equals 12 ounces of beer, 5 ounces of wine, or 1 ounces of hard liquor.  Do not use street drugs.  Do not share needles.  Ask your health care provider for help if  you need support or information about quitting drugs.  Tell your health care provider if you often feel depressed.  Tell your health care provider if you have ever been abused or do not feel safe at home.   This information is not intended to replace advice given to you by your health care provider. Make sure you discuss any questions you have with your health care provider.   Document Released: 03/22/2011 Document Revised: 09/27/2014 Document Reviewed: 08/08/2013 Elsevier Interactive Patient Education [employer].

## 2015-08-25 NOTE — Progress Notes (Signed)
Subjective:    Patient ID: Shari [name], female    DOB: 01/10/1991, 24 y.o.   MRN: [id number]  HPI   Patient presents for new patient establishment. All past medical history, surgical history, allergies, family history, immunizations and social history was obtained from the patient today and entered into the electronic medical record. Records are requested from her prior PCP, and will be reviewed at the time they are received. All medical records will be updated at that time.  Mole on back: Patient was seen dermatologist in July, which felt that the area was benign. Patient has had continued irritation and tingling sensation surrounding the lesion. She has had a similar lesion removed the posterior portion of her neck when she was in high school. She has no history of skin cancers.   Anemia after delivery: Pt reports she continues to take prenatal vitamin only because she is of the child bearing years, and not because she is actively attempting to become pregnancy. She was anemic after SVD in July with HGb of 6.5. She states [facility] that [facility] normal. She denies any fatigue, chest pain, dizziness or ringing in the ears.   Health maintenance:  Colonoscopy: No FHX, routine screen at 50 Mammogram: No fhx, routine screen at 40, Has OB/GYN Cervical cancer screening: UTD 2016, Follows with Ob/GYN, no abnl PAP Immunizations: Flu nad Tdap UTD Infectious disease screening: HIV screen completed, Negative  Past Medical History  Diagnosis Date  . Medical history non-contributory   . Normal pregnancy 04/18/2014  . SVD (spontaneous vaginal delivery) 04/19/2014  . Anemia     after delivery   No Known Allergies Past Surgical History  Procedure Laterality Date  . Wisdom tooth extraction    . Breast surgery Left     cyst aspiration   Family History  Problem Relation Age of Onset  . Heart disease Father   . Hashimoto's thyroiditis Sister     . Heart disease Maternal Grandfather    Social History   Social History  . Marital Status: Married    Spouse Name: N/A  . Number of Children: N/A  . Years of Education: N/A   Social History Main Topics  . Smoking status: Never Smoker   . Smokeless tobacco: Never Used  . Alcohol Use: No  . Drug Use: No  . Sexual Activity: Yes    Birth Control/ Protection: None   Other Topics Concern  . None   Social History Narrative   Married. Husband's names Shari [name]. One child (female) Shari [name].   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    Drinks caffeinated beverages. Takes a daily vitamin.   Wears her seatbelt. Smoke detector located in the home. Firearms located in the home and a locked.   Feels safe in her relationships.       Review of Systems Negative, with the exception of above mentioned in HPI     Objective:   Physical Exam BP 124/79 mmHg  Pulse 103  Temp(Src) 98.2 F (36.8 C) (Oral)  Resp 20  Ht 5' 3.5" (1.613 m)  Wt 117 lb (53.071 kg)  BMI 20.40 kg/m2  SpO2 100%  LMP 07/30/2015  Breastfeeding? No Gen: Afebrile. No acute distress. Nontoxic in appearance. Thin, pleasant, caucasian female.  HENT: AT. [place]. Bilateral TM visualized and normal in appearance. MMM, no oral lesions. Bilateral nares without erythema or swelling. Throat without erythema or exudates. Good dentition. No cough  or hoarseness on exam.  Eyes:Pupils Equal Round Reactive to light, Extraocular movements intact,  Conjunctiva without redness, discharge or icterus. Neck/lymp/endocrine: Supple, No lymphadenopathy, No thyromegaly CV: RRR no m/c/g/r, No edema, +2/4 P posterior tibialis pulses Chest: CTAB, no wheeze or crackles,no rhonchi. Good air movement, normal resp effort.  Abd: Soft. flat. NTND. BS present. No Masses palpated. No rebound or Hepatosplenomegaly.  MSK: No erythema, joint swelling or obvious deformity, Normal ROM. Skin: No  rashes, purpura or petechiae. Left  Scapular ~2.5cm  X 0.5 cm uniform  linear colored/brown verrucous lesion. No erythema, drainage or bleeding.  Neuro: Normal gait. PERLA. EOMi. Alert. Oriented x3 Cranial nerves II through XII intact. Muscle strength 5/5 UE/LE extremity.  Psych: Normal affect, dress and demeanor. Normal speech. Normal thought content and judgment..     Assessment & Plan:  [name] is a 24 y.o. female present for establishment of care/annual physical.  Encounter to establish care/ Encounter for preventive health examination - AVS on health maintenance provided to pt today. - Exercise > 150 minutes a week, diet full of fresh fruit and vegetables, lean meats.   2. Anemia, unspecified anemia type - Last Hgb ~6.5 after SVD in July. States she has had a POCT test in the OB [facility], that was normal. No symptoms. - CBC w/Diff  3. Skin lesion of back:  - appears benign, however within bra-line and becomes painful and irritated. - pt to monitor for any changes. If becomes irritated or she experiences any changes (growth/color) she is to make appt to have removed. Discussed would likely need to shave biopsy the area secondary to size.   F/U 1 year for CPE

## 2015-08-25 NOTE — Telephone Encounter (Signed)
Spoke with patient reviewed lab results. 

## 2015-08-29 ENCOUNTER — Encounter: Payer: Self-pay | Admitting: Family Medicine

## 2015-08-29 DIAGNOSIS — H1010 Acute atopic conjunctivitis, unspecified eye: Secondary | ICD-10-CM | POA: Insufficient documentation

## 2015-09-05 ENCOUNTER — Ambulatory Visit (INDEPENDENT_AMBULATORY_CARE_PROVIDER_SITE_OTHER): Payer: 59 | Admitting: Family Medicine

## 2015-09-05 ENCOUNTER — Encounter: Payer: Self-pay | Admitting: Family Medicine

## 2015-09-05 VITALS — BP 112/75 | HR 78 | Temp 97.9°F | Resp 20 | Wt 120.8 lb

## 2015-09-05 DIAGNOSIS — B360 Pityriasis versicolor: Secondary | ICD-10-CM | POA: Diagnosis not present

## 2015-09-05 DIAGNOSIS — L989 Disorder of the skin and subcutaneous tissue, unspecified: Secondary | ICD-10-CM | POA: Diagnosis not present

## 2015-09-05 HISTORY — DX: Disorder of the skin and subcutaneous tissue, unspecified: L98.9

## 2015-09-05 MED ORDER — SELENIUM SULFIDE 2.5 % EX LOTN: 1.0000 "application " | TOPICAL_LOTION | Freq: Every day | CUTANEOUS | Status: DC

## 2015-09-05 NOTE — Progress Notes (Signed)
   Subjective:    Patient ID: Shari [name], female    DOB: 06/02/1991, 24 y.o.   MRN: [id number]  HPI  Left scapular skin lesion: Patient presents today to have left scapular skin lesion removal. Patient states that the area is becoming painful, and catching on her clothing. She feels a tingling/burning sensation frequently surrounding the area. She has had a similar skin lesion removed from the posterior portion of her neck in the past. She states her mother also has similar skin lesions. Denies any changes in color, but feels that it is getting longer.  Past Medical History  Diagnosis Date  . Medical history non-contributory   . Normal pregnancy 04/18/2014  . SVD (spontaneous vaginal delivery) 04/19/2014  . Anemia     after delivery   No Known Allergies    Review of Systems Negative, with the exception of above mentioned in HPI     Objective:   Physical Exam BP 112/75 mmHg  Pulse 78  Temp(Src) 97.9 F (36.6 C) (Oral)  Resp 20  Wt 120 lb 12 oz (54.772 kg)  SpO2 100%  LMP 08/26/2015 Gen: Afebrile. No acute distress. Nontoxic in appearance, well-developed, well-nourished, Caucasian female.  Skin: No purpura or petechiae. Left scapular approximately 2.5 cm x 0.5 cm uniform linear brown colored raised [place] like lesion. A few images posterior to this area is a mild hyperpigmented area, no dry flakiness scaliness. The skin biopsy was performed on the left scapular region. The patient was consented for skin biopsy. The complications, instructions as to how the procedure will be performed, and postoperative instructions were given to the patient.  PROCEDURE: The site was cleaned with antiseptic. 2 mL of  local anesthetic (1% lidocaine with epinephrine) injected surrounding site. A shave biopsy was performed with careful attention to obtain both affected tissue and normal tissue. The site was then checked for bleeding. Once hemostasis was achieved, a local antibiotic ointment was  placed and the site was bandaged.  The patient was not on any anticoagulant medications. There were also no other medications which would affect the ability to conduct the skin biopsy. The patient was further instructed to keep the site completely dry for the next 24 hours, after which a new Band-Aid and antibiotic ointment should be applied to the area. They were further instructed to avoid getting the site dirty or infected. The patient completed the procedure without any complications.   F/U: 1-2 weeks.   The biopsy will  be sent for analysis.       Assessment & Plan:  1. Tinea versicolor - Small area of hyper redictation posterior to lesion could be consistent with mild tinea versicolor. Will treat with Selsun. Patient was given AVS information on proper use. - selenium sulfide (SELSUN) 2.5 % shampoo; Apply 1 application topically daily. Apply  Qd x7 and lather with small amount of water, allow to sit on area for 10 minutes then rinse.  Dispense: 118 mL; Refill: 0  2. Back skin lesion - Patient tolerated procedure well today and instructed on dressing changes. - Dermatology pathology  - Follow-up in 2-3 weeks if needed only.

## 2015-09-05 NOTE — Patient Instructions (Signed)
I have called in a medication called selenium sulfide lotion/shampoo. Apply to affected area daily for 7-14 days. Lather with a small amount of water and allow it to sit on affected area for 10 minutes.   Keep area of lesion removal  Today clean and dry. Apply antibiotic ointment to area until well healed, Keep covered, clean and dry. If any signs of infection (redness, drainage etc) be seen immediately.

## 2015-09-09 ENCOUNTER — Telehealth: Payer: Self-pay | Admitting: Family Medicine

## 2015-09-09 NOTE — Telephone Encounter (Signed)
Please call pt: - her pathology on her skin lesion was non-cancerous.

## 2015-09-10 NOTE — Telephone Encounter (Signed)
Patient aware of results.  Pt had no questions at this time.  

## 2016-05-26 DIAGNOSIS — N911 Secondary amenorrhea: Secondary | ICD-10-CM | POA: Diagnosis not present

## 2016-05-26 DIAGNOSIS — Z3201 Encounter for pregnancy test, result positive: Secondary | ICD-10-CM | POA: Diagnosis not present

## 2016-06-17 DIAGNOSIS — O26891 Other specified pregnancy related conditions, first trimester: Secondary | ICD-10-CM | POA: Diagnosis not present

## 2016-06-17 DIAGNOSIS — Z3A09 9 weeks gestation of pregnancy: Secondary | ICD-10-CM | POA: Diagnosis not present

## 2016-06-17 DIAGNOSIS — Z36 Encounter for antenatal screening of mother: Secondary | ICD-10-CM | POA: Diagnosis not present

## 2016-06-17 LAB — OB RESULTS CONSOLE RUBELLA ANTIBODY, IGM: RUBELLA: IMMUNE

## 2016-06-17 LAB — OB RESULTS CONSOLE HEPATITIS B SURFACE ANTIGEN: Hepatitis B Surface Ag: NEGATIVE

## 2016-06-17 LAB — OB RESULTS CONSOLE GC/CHLAMYDIA
Chlamydia: NEGATIVE
GC PROBE AMP, GENITAL: NEGATIVE

## 2016-06-17 LAB — OB RESULTS CONSOLE ABO/RH: RH TYPE: POSITIVE

## 2016-06-17 LAB — OB RESULTS CONSOLE ANTIBODY SCREEN: Antibody Screen: NEGATIVE

## 2016-06-17 LAB — OB RESULTS CONSOLE HIV ANTIBODY (ROUTINE TESTING): HIV: NONREACTIVE

## 2016-06-17 LAB — OB RESULTS CONSOLE RPR: RPR: NONREACTIVE

## 2016-06-28 ENCOUNTER — Encounter (HOSPITAL_COMMUNITY): Payer: Self-pay | Admitting: Emergency Medicine

## 2016-06-28 ENCOUNTER — Emergency Department (HOSPITAL_COMMUNITY)
Admission: EM | Admit: 2016-06-28 | Discharge: 2016-06-28 | Disposition: A | Discharge status: Home / Self Care | Payer: 59 | Attending: Emergency Medicine | Admitting: Emergency Medicine

## 2016-06-28 ENCOUNTER — Emergency Department (HOSPITAL_COMMUNITY): Payer: 59

## 2016-06-28 DIAGNOSIS — Z3A11 11 weeks gestation of pregnancy: Secondary | ICD-10-CM | POA: Insufficient documentation

## 2016-06-28 DIAGNOSIS — R102 Pelvic and perineal pain: Secondary | ICD-10-CM | POA: Insufficient documentation

## 2016-06-28 DIAGNOSIS — O26891 Other specified pregnancy related conditions, first trimester: Secondary | ICD-10-CM | POA: Diagnosis not present

## 2016-06-28 DIAGNOSIS — R1031 Right lower quadrant pain: Secondary | ICD-10-CM | POA: Insufficient documentation

## 2016-06-28 LAB — URINE MICROSCOPIC-ADD ON: RBC / HPF: NONE SEEN RBC/hpf (ref 0–5)

## 2016-06-28 LAB — BASIC METABOLIC PANEL
ANION GAP: 9 (ref 5–15)
BUN: 10 mg/dL (ref 6–20)
CALCIUM: 9 mg/dL (ref 8.9–10.3)
CHLORIDE: 105 mmol/L (ref 101–111)
CO2: 21 mmol/L — AB (ref 22–32)
CREATININE: 0.62 mg/dL (ref 0.44–1.00)
GFR calc Af Amer: >60 mL/min (ref >60)
GFR calc non Af Amer: >60 mL/min (ref >60)
Glucose, Bld: 103 mg/dL — ABNORMAL HIGH (ref 65–99)
Potassium: 3.2 mmol/L — ABNORMAL LOW (ref 3.5–5.1)
Sodium: 135 mmol/L (ref 135–145)

## 2016-06-28 LAB — CBC WITH DIFFERENTIAL/PLATELET
BASOS PCT: 0 %
Basophils Absolute: 0 10*3/uL (ref 0.0–0.1)
EOS ABS: 0.1 10*3/uL (ref 0.0–0.7)
Eosinophils Relative: 1 %
HEMATOCRIT: 37.8 % (ref 36.0–46.0)
HEMOGLOBIN: 13 g/dL (ref 12.0–15.0)
Lymphocytes Relative: 22 %
Lymphs Abs: 2.2 10*3/uL (ref 0.7–4.0)
MCH: 31.3 pg (ref 26.0–34.0)
MCHC: 34.4 g/dL (ref 30.0–36.0)
MCV: 91.1 fL (ref 78.0–100.0)
Monocytes Absolute: 0.8 10*3/uL (ref 0.1–1.0)
Monocytes Relative: 8 %
NEUTROS ABS: 6.7 10*3/uL (ref 1.7–7.7)
Neutrophils Relative %: 69 %
Platelets: 305 10*3/uL (ref 150–400)
RBC: 4.15 MIL/uL (ref 3.87–5.11)
RDW: 12.9 % (ref 11.5–15.5)
WBC: 9.7 10*3/uL (ref 4.0–10.5)

## 2016-06-28 LAB — URINALYSIS, ROUTINE W REFLEX MICROSCOPIC
Bilirubin Urine: NEGATIVE
Glucose, UA: NEGATIVE mg/dL
Hgb urine dipstick: NEGATIVE
KETONES UR: NEGATIVE mg/dL
NITRITE: NEGATIVE
PH: 6.5 (ref 5.0–8.0)
Protein, ur: NEGATIVE mg/dL
SPECIFIC GRAVITY, URINE: 1.013 (ref 1.005–1.030)

## 2016-06-28 LAB — WET PREP, GENITAL
Clue Cells Wet Prep HPF POC: NONE SEEN
Sperm: NONE SEEN
Trich, Wet Prep: NONE SEEN
Yeast Wet Prep HPF POC: NONE SEEN

## 2016-06-28 NOTE — ED Provider Notes (Signed)
[facility]-EMERGENCY DEPT Provider Note   CSN: [id number] Arrival date & time: 06/28/16  1733     History   Chief Complaint Chief Complaint  Patient presents with  . RLQ pain    HPI Shari Loutmily T Sanderfer is a 25 y.o. female.  HPI Shari [name] is a 25 y.o. female presents to emergency department complaining of right lower quadrant abdominal pain, nausea, diaphoresis, dizziness. Patient states pain started suddenly while at work today. Patient works for an Health and safety inspectorB/GYN physician. She states she is [redacted] weeks pregnant. She states this pain is nothing that she has ever experienced in the past. She states that her doctor that she works with did a bedside ultrasound to look at the baby, states "it looks good." Patient denies any vaginal discharge or bleeding. She denies any vomiting. She denies any back pain. Denies any hematuria, dysuria. States pain has been easing off but still feels it.   Past Medical History:  Diagnosis Date  . Anemia    after delivery  . Medical history non-contributory   . Normal pregnancy 04/18/2014  . SVD (spontaneous vaginal delivery) 04/19/2014    Patient Active Problem List   Diagnosis Date Noted  . Tinea versicolor 09/05/2015  . Back skin lesion 09/05/2015  . Allergic conjunctivitis 08/29/2015  . Encounter to establish care 08/25/2015  . Health care maintenance 08/25/2015  . Anemia 08/25/2015  . Encounter for preventive health examination 08/25/2015    Past Surgical History:  Procedure Laterality Date  . BREAST SURGERY Left    cyst aspiration  . WISDOM TOOTH EXTRACTION      OB History    Gravida Para Term Preterm AB Living   1 1 1     1    SAB TAB Ectopic Multiple Live Births           1       Home Medications    Prior to Admission medications   Medication Sig Start Date End Date Taking? Authorizing Provider  Prenatal Vit-Fe Fumarate-FA (PRENATAL MULTIVITAMIN) TABS tablet Take 1 tablet by mouth daily at 12 noon. 04/21/14   Sherian ReinJody Bovard-Stuckert, MD    selenium sulfide (SELSUN) 2.5 % shampoo Apply 1 application topically daily. Apply  Qd x7 and lather with small amount of water, allow to sit on area for 10 minutes then rinse. 09/05/15   Natalia Leatherwoodenee A Kuneff, DO    Family History Family History  Problem Relation Age of Onset  . Heart disease Father   . Hashimoto's thyroiditis Sister   . Heart disease Maternal Grandfather     Social History Social History  Substance Use Topics  . Smoking status: Never Smoker  . Smokeless tobacco: Never Used  . Alcohol use No     Allergies   Review of patient's allergies indicates no known allergies.   Review of Systems Review of Systems  Constitutional: Negative for chills and fever.  Respiratory: Negative for cough, chest tightness and shortness of breath.   Cardiovascular: Negative for chest pain, palpitations and leg swelling.  Gastrointestinal: Positive for abdominal pain and nausea. Negative for diarrhea and vomiting.  Genitourinary: Positive for pelvic pain. Negative for dysuria, flank pain, vaginal bleeding, vaginal discharge and vaginal pain.  Musculoskeletal: Negative for arthralgias, myalgias, neck pain and neck stiffness.  Skin: Negative for rash.  Neurological: Negative for dizziness, weakness and headaches.  All other systems reviewed and are negative.    Physical Exam Updated Vital Signs BP 132/79 (BP Location: Left Arm)   Pulse 102  Temp 97.7 F (36.5 C) (Oral)   Resp 18   SpO2 100%   Physical Exam  Constitutional: She appears well-developed and well-nourished. No distress.  HENT:  Head: Normocephalic.  Eyes: Conjunctivae are normal.  Neck: Neck supple.  Cardiovascular: Normal rate, regular rhythm and normal heart sounds.   Pulmonary/Chest: Effort normal and breath sounds normal. No respiratory distress. She has no wheezes. She has no rales.  Abdominal: Soft. Bowel sounds are normal. She exhibits no distension. There is tenderness. There is no rebound.  RLQ  tenderness  Genitourinary:  Genitourinary Comments: Normal external genitalia. Normal vaginal canal. Small thin white discharge. Cervix is normal, closed. No CMT. No uterine  tenderness. Right adnexal tenderness. No left adnexal tendereness. Uterus gravid. No masses palpated.    Musculoskeletal: She exhibits no edema.  Neurological: She is alert.  Skin: Skin is warm and dry.  Psychiatric: She has a normal mood and affect. Her behavior is normal.  Nursing note and vitals reviewed.    ED Treatments / Results  Labs (all labs ordered are listed, but only abnormal results are displayed) Labs Reviewed  WET PREP, GENITAL - Abnormal; Notable for the following:       Result Value   WBC, Wet Prep HPF POC MANY (*)    All other components within normal limits  URINALYSIS, ROUTINE W REFLEX MICROSCOPIC (NOT AT [facility]) - Abnormal; Notable for the following:    Leukocytes, UA SMALL (*)    All other components within normal limits  BASIC METABOLIC PANEL - Abnormal; Notable for the following:    Potassium 3.2 (*)    CO2 21 (*)    Glucose, Bld 103 (*)    All other components within normal limits  URINE MICROSCOPIC-ADD ON - Abnormal; Notable for the following:    Squamous Epithelial / LPF 0-5 (*)    Bacteria, UA FEW (*)    All other components within normal limits  CBC WITH DIFFERENTIAL/PLATELET  GC/CHLAMYDIA PROBE AMP ([place]) NOT AT [facility]    EKG  EKG Interpretation None       Radiology US Ob Comp Less 14 Wks  Result Date: 06/28/2016 CLINICAL DATA:  Right adnexal pain x1 day EXAM: OBSTETRIC <14 WK [place] AND TRANSVAGINAL OB US DOPPLER ULTRASOUND OF OVARIES TECHNIQUE: Both transabdominal and transvaginal ultrasound examinations were performed for complete evaluation of the gestation as well as the maternal uterus, adnexal regions, and pelvic cul-de-sac. Transvaginal technique was performed to assess early pregnancy. Color and duplex Doppler ultrasound was utilized to evaluate blood flow to  the ovaries. COMPARISON:  None. FINDINGS: Intrauterine gestational sac: Single intrauterine gestational sac present. Yolk sac:  Visualized Embryo:  Single intrauterine gestation present. Cardiac Activity: Present Heart Rate: 168 bpm CRL:   5.09  cm   11 w 6 d                  [place] EDC: 01/12/2017 Subchorionic hemorrhage:  None visualized. Maternal uterus/adnexae: Minimal amount of fluid in the cervical canal. Uterus measures 11 cm transverse by 8.3 cm AP x 11.8 cm longitudinal. The right ovary measures 3.7 x 2 by 3.3 cm and contains a normal-appearing follicles. The left ovary measures 3 x 1.1 by 1.7 cm. No significant pelvic fluid. Pulsed Doppler evaluation of both ovaries demonstrates normal appearing low-resistance arterial and venous waveforms. IMPRESSION: 1. Single intrauterine gestation with estimated gestational age and dates as above. 2. Trace amount of fluid in the cervical canal. 3. Normal ultrasound appearance of the ovaries  with no sonographic evidence for torsion. Electronically Signed   By: Jasmine Pang M.D.   On: 06/28/2016 20:21   US Ob Transvaginal  Result Date: 06/28/2016 CLINICAL DATA:  Right adnexal pain x1 day EXAM: OBSTETRIC <14 WK [place] AND TRANSVAGINAL OB US DOPPLER ULTRASOUND OF OVARIES TECHNIQUE: Both transabdominal and transvaginal ultrasound examinations were performed for complete evaluation of the gestation as well as the maternal uterus, adnexal regions, and pelvic cul-de-sac. Transvaginal technique was performed to assess early pregnancy. Color and duplex Doppler ultrasound was utilized to evaluate blood flow to the ovaries. COMPARISON:  None. FINDINGS: Intrauterine gestational sac: Single intrauterine gestational sac present. Yolk sac:  Visualized Embryo:  Single intrauterine gestation present. Cardiac Activity: Present Heart Rate: 168 bpm CRL:   5.09  cm   11 w 6 d                  [place] EDC: 01/12/2017 Subchorionic hemorrhage:  None visualized. Maternal uterus/adnexae: Minimal amount  of fluid in the cervical canal. Uterus measures 11 cm transverse by 8.3 cm AP x 11.8 cm longitudinal. The right ovary measures 3.7 x 2 by 3.3 cm and contains a normal-appearing follicles. The left ovary measures 3 x 1.1 by 1.7 cm. No significant pelvic fluid. Pulsed Doppler evaluation of both ovaries demonstrates normal appearing low-resistance arterial and venous waveforms. IMPRESSION: 1. Single intrauterine gestation with estimated gestational age and dates as above. 2. Trace amount of fluid in the cervical canal. 3. Normal ultrasound appearance of the ovaries with no sonographic evidence for torsion. Electronically Signed   By: Jasmine Pang M.D.   On: 06/28/2016 20:21   [place] Art/ven Flow Abd Pelv Doppler  Result Date: 06/28/2016 CLINICAL DATA:  Right adnexal pain x1 day EXAM: OBSTETRIC <14 WK [place] AND TRANSVAGINAL OB US DOPPLER ULTRASOUND OF OVARIES TECHNIQUE: Both transabdominal and transvaginal ultrasound examinations were performed for complete evaluation of the gestation as well as the maternal uterus, adnexal regions, and pelvic cul-de-sac. Transvaginal technique was performed to assess early pregnancy. Color and duplex Doppler ultrasound was utilized to evaluate blood flow to the ovaries. COMPARISON:  None. FINDINGS: Intrauterine gestational sac: Single intrauterine gestational sac present. Yolk sac:  Visualized Embryo:  Single intrauterine gestation present. Cardiac Activity: Present Heart Rate: 168 bpm CRL:   5.09  cm   11 w 6 d                  [place] EDC: 01/12/2017 Subchorionic hemorrhage:  None visualized. Maternal uterus/adnexae: Minimal amount of fluid in the cervical canal. Uterus measures 11 cm transverse by 8.3 cm AP x 11.8 cm longitudinal. The right ovary measures 3.7 x 2 by 3.3 cm and contains a normal-appearing follicles. The left ovary measures 3 x 1.1 by 1.7 cm. No significant pelvic fluid. Pulsed Doppler evaluation of both ovaries demonstrates normal appearing low-resistance arterial and  venous waveforms. IMPRESSION: 1. Single intrauterine gestation with estimated gestational age and dates as above. 2. Trace amount of fluid in the cervical canal. 3. Normal ultrasound appearance of the ovaries with no sonographic evidence for torsion. Electronically Signed   By: Jasmine Pang M.D.   On: 06/28/2016 20:21    Procedures Procedures (including critical care time)  Medications Ordered in ED Medications - No data to display   Initial Impression / Assessment and Plan / ED Course  I have reviewed the triage vital signs and the nursing notes.  Pertinent labs & imaging results that were available during my care of the  patient were reviewed by me and considered in my medical decision making (see chart for details).  Clinical Course    Pt with sudden onset of right lower quadrant pain while at work. Pt [redacted]wks pregnant. No bleeding or vaginal discharge. RLQ tenderness on palpation. Will check labs and [place].   9:16 PM Patient's pain improved while emergency department. I reassessed her, she continues to have mild right lower quadrant abdominal tenderness, but denies any pain other than when palpating that area. I discussed with patient getting MRI to rule out appendicitis. I actually spoke with OB/GYN on-call for Dr. Ellyn Hack, we discussed that since patient's pain improving, it is not unreasonable to let her go home and follow-up. He did inform me that Dr. 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 I discussed plan with the patient and her husband. She is very concerned about getting MRI, because she is claustrophobic and concerned about the cost. I explained to her I cannot completely rule out appendicitis at this time, and recommend MRI. I did however discussed with her option of going home tonight and returning if her pain worsens or following up with her OB/GYN in the morning. I ambulated myself patient in the hallway, I made her walk with high  knees, and jump up and down, and she reported no pain doing that. After discussing risks of leaving without obtaining an MRI, patient decided that she will go home and will come back if her symptoms get worse and will try to follow-up with Dr. Ellyn Hack first thing in the morning.  Final Clinical Impressions(s) / ED Diagnoses   Final diagnoses:  Adnexal pain  Right lower quadrant pain    New Prescriptions New Prescriptions   No medications on file     Iona Coach 06/28/16 2127    Bethann Berkshire, MD 06/28/16 2221

## 2016-06-28 NOTE — ED Triage Notes (Signed)
Patient was at work when started having RLQ pain about 2 hours ago.  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 MD works with did ultrasound and couldn't find appendix.  Patent denies any new n/v/d with RLQ pain.

## 2016-06-28 NOTE — Discharge Instructions (Signed)
Tylenol for pain. We were unable to completely rule out appendicitis. Your ultrasound is normal. You are 11w 6d based on this ultrasound. Please see your doctor first thing tomorrow morning for recheck. Return if symptoms are worsening.

## 2016-06-29 LAB — GC/CHLAMYDIA PROBE AMP (~~LOC~~) NOT AT ARMC
Chlamydia: NEGATIVE
NEISSERIA GONORRHEA: NEGATIVE

## 2016-07-09 DIAGNOSIS — Z3682 Encounter for antenatal screening for nuchal translucency: Secondary | ICD-10-CM | POA: Diagnosis not present

## 2016-07-09 DIAGNOSIS — Z369 Encounter for antenatal screening, unspecified: Secondary | ICD-10-CM | POA: Diagnosis not present

## 2016-07-09 DIAGNOSIS — Z3A13 13 weeks gestation of pregnancy: Secondary | ICD-10-CM | POA: Diagnosis not present

## 2016-08-25 DIAGNOSIS — Z363 Encounter for antenatal screening for malformations: Secondary | ICD-10-CM | POA: Diagnosis not present

## 2016-08-25 DIAGNOSIS — Z3A19 19 weeks gestation of pregnancy: Secondary | ICD-10-CM | POA: Diagnosis not present

## 2016-09-20 NOTE — L&D Delivery Note (Signed)
Delivery Note At 5:53 PM a viable and healthy female was delivered via Vaginal, Spontaneous Delivery (Presentation: OA; LOT  ).  APGAR: 9,9 ; weight  .   Placenta status: delivered, intact .  Cord: 3V  with the following complications: none.    Anesthesia:  epidural Episiotomy: None Lacerations: None Suture Repair: N/A Est. Blood Loss (mL): 150cc  Mom to postpartum.  Baby to Couplet care / Skin to Skin.  Shari [name] 12/28/2016, 6:13 PM  Desires circumcision for female infant, d/w pt and FOB r/b/a  Br/RI/REFUSED tDAP IN pnc/AB+/Contra?

## 2016-10-20 DIAGNOSIS — Z3689 Encounter for other specified antenatal screening: Secondary | ICD-10-CM | POA: Diagnosis not present

## 2016-11-29 DIAGNOSIS — Z3A33 33 weeks gestation of pregnancy: Secondary | ICD-10-CM | POA: Diagnosis not present

## 2016-11-29 DIAGNOSIS — O3663X1 Maternal care for excessive fetal growth, third trimester, fetus 1: Secondary | ICD-10-CM | POA: Diagnosis not present

## 2016-12-13 MED FILL — hydrOXYzine HCL 25 MG TABS: 25 | 4 days supply | Qty: 30 | Fill #0

## 2016-12-15 DIAGNOSIS — Z3685 Encounter for antenatal screening for Streptococcus B: Secondary | ICD-10-CM | POA: Diagnosis not present

## 2016-12-15 DIAGNOSIS — L299 Pruritus, unspecified: Secondary | ICD-10-CM | POA: Diagnosis not present

## 2016-12-15 DIAGNOSIS — O26843 Uterine size-date discrepancy, third trimester: Secondary | ICD-10-CM | POA: Diagnosis not present

## 2016-12-15 DIAGNOSIS — Z3A35 35 weeks gestation of pregnancy: Secondary | ICD-10-CM | POA: Diagnosis not present

## 2016-12-15 MED FILL — TRIAMCINOLONE 0.5% CREAM: 0.5 | 20 days supply | Qty: 30 | Fill #0

## 2016-12-16 LAB — OB RESULTS CONSOLE GBS: GBS: NEGATIVE

## 2016-12-20 DIAGNOSIS — Z3A36 36 weeks gestation of pregnancy: Secondary | ICD-10-CM | POA: Diagnosis not present

## 2016-12-20 DIAGNOSIS — O26613 Liver and biliary tract disorders in pregnancy, third trimester: Secondary | ICD-10-CM | POA: Diagnosis not present

## 2016-12-22 ENCOUNTER — Encounter: Payer: Self-pay | Admitting: *Deleted

## 2016-12-23 ENCOUNTER — Ambulatory Visit (INDEPENDENT_AMBULATORY_CARE_PROVIDER_SITE_OTHER): Payer: 59

## 2016-12-23 VITALS — BP 116/72 | HR 76

## 2016-12-23 DIAGNOSIS — Z3490 Encounter for supervision of normal pregnancy, unspecified, unspecified trimester: Secondary | ICD-10-CM

## 2016-12-23 DIAGNOSIS — O26613 Liver and biliary tract disorders in pregnancy, third trimester: Secondary | ICD-10-CM

## 2016-12-23 DIAGNOSIS — O26619 Liver and biliary tract disorders in pregnancy, unspecified trimester: Secondary | ICD-10-CM | POA: Diagnosis not present

## 2016-12-23 DIAGNOSIS — K831 Obstruction of bile duct: Secondary | ICD-10-CM | POA: Diagnosis not present

## 2016-12-23 DIAGNOSIS — Z3A36 36 weeks gestation of pregnancy: Secondary | ICD-10-CM | POA: Diagnosis not present

## 2016-12-23 MED ORDER — BETAMETHASONE SOD PHOS & ACET 6 (3-3) MG/ML IJ SUSP
12.0000 mg | Freq: Once | INTRAMUSCULAR | Status: AC
  Administered 2016-12-23: 12 mg via INTRAMUSCULAR

## 2016-12-23 NOTE — Progress Notes (Signed)
Patient presented to office today for her first 1 of 2 betamethasone injection. Patient vitals were in normal range. Patient tolerated injection well and will follow up tomorrow for her second injection.

## 2016-12-24 ENCOUNTER — Telehealth (HOSPITAL_COMMUNITY): Payer: Self-pay | Admitting: *Deleted

## 2016-12-24 ENCOUNTER — Encounter (HOSPITAL_COMMUNITY): Payer: Self-pay | Admitting: *Deleted

## 2016-12-24 ENCOUNTER — Ambulatory Visit (INDEPENDENT_AMBULATORY_CARE_PROVIDER_SITE_OTHER): Payer: 59 | Admitting: *Deleted

## 2016-12-24 DIAGNOSIS — O26613 Liver and biliary tract disorders in pregnancy, third trimester: Secondary | ICD-10-CM | POA: Diagnosis not present

## 2016-12-24 DIAGNOSIS — K831 Obstruction of bile duct: Secondary | ICD-10-CM

## 2016-12-24 MED ORDER — BETAMETHASONE SOD PHOS & ACET 6 (3-3) MG/ML IJ SUSP
12.0000 mg | Freq: Once | INTRAMUSCULAR | Status: AC
  Administered 2016-12-24: 12 mg via INTRAMUSCULAR

## 2016-12-24 NOTE — Telephone Encounter (Signed)
Preadmission screen  

## 2016-12-27 DIAGNOSIS — O26613 Liver and biliary tract disorders in pregnancy, third trimester: Secondary | ICD-10-CM | POA: Diagnosis not present

## 2016-12-27 DIAGNOSIS — Z3A37 37 weeks gestation of pregnancy: Secondary | ICD-10-CM | POA: Diagnosis not present

## 2016-12-27 NOTE — H&P (Addendum)
Shari [name] is a 26 y.o. female G2P1001 at 49+ with cholestasis of pregnancy by bile acids of 10.  Otherwise uncomplicated PNC.  Tdap declined, received flu shot at work.  Received BMZ 4/5 and 4/6 for early term delivered.  D/W pt IOL and process.    OB History    Gravida Para Term Preterm AB Living   SAB TAB Ectopic Multiple Live Births           1    G1 TSVD 8# G2 present  No abn pap - last 9/16 Pap WNL No STDs  Past Medical History:  Diagnosis Date  . Anemia    after delivery  . Cholestasis during pregnancy   . Medical history non-contributory   . Normal pregnancy 04/18/2014  . SVD (spontaneous vaginal delivery) 04/19/2014   Past Surgical History:  Procedure Laterality Date  . BREAST SURGERY Left    cyst aspiration  . WISDOM TOOTH EXTRACTION     Family History: family history includes Hashimoto's thyroiditis in her sister; Heart disease in her father and maternal grandfather. Social History:  reports that she has never smoked. She has never used smokeless tobacco. She reports that she does not drink alcohol or use drugs.married.  [facility].    Meds Ursodiol, PNV, triamcinolone, hydroxyzine All NKDA     Maternal Diabetes: No Genetic Screening: Normal Maternal Ultrasounds/Referrals: Normal Fetal Ultrasounds or other Referrals:  None Maternal Substance Abuse:  No Significant Maternal Medications:  Meds include: Other: ursodiol Significant Maternal Lab Results:  Lab values include: Group B Strep negative Other Comments:  None  Review of Systems  Constitutional: Negative.   HENT: Negative.   Eyes: Negative.   Respiratory: Negative.   Cardiovascular: Negative.   Gastrointestinal: Negative.   Genitourinary: Negative.   Musculoskeletal: Positive for back pain.  Skin: Positive for itching.  Neurological: Negative.   Psychiatric/Behavioral: Negative.    Maternal Medical History:  Contractions: Frequency: irregular.   Perceived  severity is moderate.    Fetal activity: Perceived fetal activity is normal.    Prenatal Complications - Diabetes: none.      Last menstrual period 04/12/2016. Maternal Exam:  Abdomen: Patient reports no abdominal tenderness. Fundal height is APPROPRIATE FOR GESTATION.   Estimated fetal weight is 8-9#.   Fetal presentation: vertex  Introitus: Normal vulva. Normal vagina.  Cervix: Cervix evaluated by digital exam.     Physical Exam  Constitutional: She is oriented to person, place, and time. She appears well-developed and well-nourished.  HENT:  Head: Normocephalic and atraumatic.  Cardiovascular: Normal rate and regular rhythm.   Respiratory: Effort normal and breath sounds normal. No respiratory distress. She has no wheezes.  GI: Soft. Bowel sounds are normal. She exhibits no distension. There is no tenderness.  Musculoskeletal: Normal range of motion.  Neurological: She is alert and oriented to person, place, and time.  Skin: Skin is warm and dry.  Psychiatric: She has a normal mood and affect. Her behavior is normal.    Prenatal labs: ABO, Rh: AB/Positive/-- (09/28 0000) Antibody: Negative (09/28 0000) Rubella: Immune (09/28 0000) RPR: Nonreactive (09/28 0000)  HBsAg: Negative (09/28 0000)  HIV: Non-reactive (09/28 0000)  GBS: Negative (03/29 0000)   Hgb 13.9/Plt 315K/Ur Cx neg/Chl neg/GC neg/Varicella immune/First Trimester screen WNL/glucola 104/essential panel neg  BMZ 4/5 and 4/6 Flu at work, declined Tdap [place] nl anat, post plac (female - pt unaware) [place] good growth  Assessment/Plan:  26yo G2P1001 at 37+ for IOL with cholestasis Epidural, IV pain meds and nitrous prn for pain gbbs neg Expect SVD Paid for circumcision   Shari [name] 12/27/2016, 2:02 PM

## 2016-12-28 ENCOUNTER — Inpatient Hospital Stay (HOSPITAL_COMMUNITY): Payer: 59 | Admitting: Anesthesiology

## 2016-12-28 ENCOUNTER — Inpatient Hospital Stay (HOSPITAL_COMMUNITY)
Admission: RE | Admit: 2016-12-28 | Discharge: 2016-12-30 | DRG: 775 | Disposition: A | Discharge status: Home / Self Care | Payer: 59 | Source: Ambulatory Visit | Attending: Obstetrics and Gynecology | Admitting: Obstetrics and Gynecology

## 2016-12-28 ENCOUNTER — Encounter (HOSPITAL_COMMUNITY): Payer: Self-pay

## 2016-12-28 DIAGNOSIS — O2662 Liver and biliary tract disorders in childbirth: Secondary | ICD-10-CM | POA: Diagnosis present

## 2016-12-28 DIAGNOSIS — Z8249 Family history of ischemic heart disease and other diseases of the circulatory system: Secondary | ICD-10-CM | POA: Diagnosis not present

## 2016-12-28 DIAGNOSIS — K831 Obstruction of bile duct: Secondary | ICD-10-CM | POA: Diagnosis present

## 2016-12-28 DIAGNOSIS — Z3A37 37 weeks gestation of pregnancy: Secondary | ICD-10-CM

## 2016-12-28 DIAGNOSIS — O26613 Liver and biliary tract disorders in pregnancy, third trimester: Secondary | ICD-10-CM | POA: Diagnosis not present

## 2016-12-28 DIAGNOSIS — O26643 Intrahepatic cholestasis of pregnancy, third trimester: Secondary | ICD-10-CM | POA: Diagnosis present

## 2016-12-28 LAB — CBC
HEMATOCRIT: 36 % (ref 36.0–46.0)
HEMOGLOBIN: 12.2 g/dL (ref 12.0–15.0)
MCH: 33 pg (ref 26.0–34.0)
MCHC: 33.9 g/dL (ref 30.0–36.0)
MCV: 97.3 fL (ref 78.0–100.0)
Platelets: 319 10*3/uL (ref 150–400)
RBC: 3.7 MIL/uL — AB (ref 3.87–5.11)
RDW: 14.5 % (ref 11.5–15.5)
WBC: 14.2 10*3/uL — ABNORMAL HIGH (ref 4.0–10.5)

## 2016-12-28 LAB — RPR: RPR Ser Ql: NONREACTIVE

## 2016-12-28 MED ORDER — ACETAMINOPHEN 325 MG PO TABS: 650.0000 mg | ORAL_TABLET | ORAL | Status: DC | PRN

## 2016-12-28 MED ORDER — TETANUS-DIPHTH-ACELL PERTUSSIS 5-2.5-18.5 LF-MCG/0.5 IM SUSP: 0.5000 mL | Freq: Once | INTRAMUSCULAR | Status: DC

## 2016-12-28 MED ORDER — OXYTOCIN 40 UNITS IN LACTATED RINGERS INFUSION - SIMPLE MED: 2.5000 [IU]/h | INTRAVENOUS | Status: DC

## 2016-12-28 MED ORDER — LACTATED RINGERS IV SOLN
INTRAVENOUS | Status: DC
  Administered 2016-12-28 (×2): via INTRAVENOUS

## 2016-12-28 MED ORDER — OXYTOCIN BOLUS FROM INFUSION
500.0000 mL | Freq: Once | INTRAVENOUS | Status: AC
  Administered 2016-12-28: 500 mL via INTRAVENOUS

## 2016-12-28 MED ORDER — ZOLPIDEM TARTRATE 5 MG PO TABS: 5.0000 mg | ORAL_TABLET | Freq: Every evening | ORAL | Status: DC | PRN

## 2016-12-28 MED ORDER — OXYCODONE HCL 5 MG PO TABS: 5.0000 mg | ORAL_TABLET | ORAL | Status: DC | PRN

## 2016-12-28 MED ORDER — SOD CITRATE-CITRIC ACID 500-334 MG/5ML PO SOLN: 30.0000 mL | ORAL | Status: DC | PRN

## 2016-12-28 MED ORDER — DIPHENHYDRAMINE HCL 25 MG PO CAPS: 25.0000 mg | ORAL_CAPSULE | Freq: Four times a day (QID) | ORAL | Status: DC | PRN

## 2016-12-28 MED ORDER — EPHEDRINE 5 MG/ML INJ
10.0000 mg | INTRAVENOUS | Status: DC | PRN
  Filled 2016-12-28: qty 2

## 2016-12-28 MED ORDER — WITCH HAZEL-GLYCERIN EX PADS: 1.0000 "application " | MEDICATED_PAD | CUTANEOUS | Status: DC | PRN

## 2016-12-28 MED ORDER — PHENYLEPHRINE 40 MCG/ML (10ML) SYRINGE FOR IV PUSH (FOR BLOOD PRESSURE SUPPORT)
80.0000 ug | PREFILLED_SYRINGE | INTRAVENOUS | Status: DC | PRN
  Filled 2016-12-28: qty 5

## 2016-12-28 MED ORDER — LACTATED RINGERS IV SOLN: 500.0000 mL | INTRAVENOUS | Status: DC | PRN

## 2016-12-28 MED ORDER — COCONUT OIL OIL: 1.0000 "application " | TOPICAL_OIL | Status: DC | PRN

## 2016-12-28 MED ORDER — SENNOSIDES-DOCUSATE SODIUM 8.6-50 MG PO TABS
2.0000 | ORAL_TABLET | ORAL | Status: DC
  Filled 2016-12-28 (×2): qty 2

## 2016-12-28 MED ORDER — ONDANSETRON HCL 4 MG/2ML IJ SOLN: 4.0000 mg | INTRAMUSCULAR | Status: DC | PRN

## 2016-12-28 MED ORDER — FENTANYL 2.5 MCG/ML BUPIVACAINE 1/10 % EPIDURAL INFUSION (WH - ANES)
14.0000 mL/h | INTRAMUSCULAR | Status: DC | PRN
Start: 2016-12-28 — End: 2016-12-30
  Administered 2016-12-28 (×2): 14 mL/h via EPIDURAL
  Filled 2016-12-28 (×2): qty 100

## 2016-12-28 MED ORDER — SIMETHICONE 80 MG PO CHEW: 80.0000 mg | CHEWABLE_TABLET | ORAL | Status: DC | PRN

## 2016-12-28 MED ORDER — LACTATED RINGERS IV SOLN: 500.0000 mL | Freq: Once | INTRAVENOUS | Status: DC

## 2016-12-28 MED ORDER — BENZOCAINE-MENTHOL 20-0.5 % EX AERO
1.0000 "application " | INHALATION_SPRAY | CUTANEOUS | Status: DC | PRN
  Filled 2016-12-28: qty 56

## 2016-12-28 MED ORDER — LIDOCAINE HCL (PF) 1 % IJ SOLN
INTRAMUSCULAR | Status: DC | PRN
  Administered 2016-12-28: 5 mL via EPIDURAL
  Administered 2016-12-28: 7 mL via EPIDURAL

## 2016-12-28 MED ORDER — OXYCODONE-ACETAMINOPHEN 5-325 MG PO TABS: 1.0000 | ORAL_TABLET | ORAL | Status: DC | PRN

## 2016-12-28 MED ORDER — PHENYLEPHRINE 40 MCG/ML (10ML) SYRINGE FOR IV PUSH (FOR BLOOD PRESSURE SUPPORT)
80.0000 ug | PREFILLED_SYRINGE | INTRAVENOUS | Status: DC | PRN
  Filled 2016-12-28: qty 5
  Filled 2016-12-28: qty 10

## 2016-12-28 MED ORDER — DIPHENHYDRAMINE HCL 50 MG/ML IJ SOLN: 12.5000 mg | INTRAMUSCULAR | Status: DC | PRN

## 2016-12-28 MED ORDER — TERBUTALINE SULFATE 1 MG/ML IJ SOLN
0.2500 mg | Freq: Once | INTRAMUSCULAR | Status: DC | PRN
  Filled 2016-12-28: qty 1

## 2016-12-28 MED ORDER — SODIUM BICARBONATE 8.4 % IV SOLN
INTRAVENOUS | Status: DC | PRN
  Administered 2016-12-28 (×2): 4 mL via EPIDURAL

## 2016-12-28 MED ORDER — LIDOCAINE HCL (PF) 1 % IJ SOLN
30.0000 mL | INTRAMUSCULAR | Status: DC | PRN
  Filled 2016-12-28: qty 30

## 2016-12-28 MED ORDER — IBUPROFEN 600 MG PO TABS
600.0000 mg | ORAL_TABLET | Freq: Four times a day (QID) | ORAL | Status: DC
  Administered 2016-12-28 – 2016-12-30 (×7): 600 mg via ORAL
  Filled 2016-12-28 (×7): qty 1

## 2016-12-28 MED ORDER — BUTORPHANOL TARTRATE 1 MG/ML IJ SOLN: 1.0000 mg | INTRAMUSCULAR | Status: DC | PRN

## 2016-12-28 MED ORDER — LACTATED RINGERS IV SOLN: INTRAVENOUS | Status: DC

## 2016-12-28 MED ORDER — OXYCODONE-ACETAMINOPHEN 5-325 MG PO TABS: 2.0000 | ORAL_TABLET | ORAL | Status: DC | PRN

## 2016-12-28 MED ORDER — DIBUCAINE 1 % RE OINT: 1.0000 "application " | TOPICAL_OINTMENT | RECTAL | Status: DC | PRN

## 2016-12-28 MED ORDER — ONDANSETRON HCL 4 MG PO TABS: 4.0000 mg | ORAL_TABLET | ORAL | Status: DC | PRN

## 2016-12-28 MED ORDER — OXYCODONE HCL 5 MG PO TABS
10.0000 mg | ORAL_TABLET | ORAL | Status: DC | PRN
Start: 2016-12-28 — End: 2016-12-30

## 2016-12-28 MED ORDER — PRENATAL MULTIVITAMIN CH
1.0000 | ORAL_TABLET | Freq: Every day | ORAL | Status: DC
  Administered 2016-12-29: 1 via ORAL
  Filled 2016-12-28: qty 1

## 2016-12-28 MED ORDER — OXYTOCIN 40 UNITS IN LACTATED RINGERS INFUSION - SIMPLE MED
1.0000 m[IU]/min | INTRAVENOUS | Status: DC
  Administered 2016-12-28: 2 m[IU]/min via INTRAVENOUS
  Filled 2016-12-28: qty 1000

## 2016-12-28 MED ORDER — ACETAMINOPHEN 325 MG PO TABS
650.0000 mg | ORAL_TABLET | ORAL | Status: DC | PRN
  Administered 2016-12-29: 650 mg via ORAL
  Filled 2016-12-28: qty 2

## 2016-12-28 MED ORDER — ONDANSETRON HCL 4 MG/2ML IJ SOLN: 4.0000 mg | Freq: Four times a day (QID) | INTRAMUSCULAR | Status: DC | PRN

## 2016-12-28 NOTE — Anesthesia Pain Management Evaluation Note (Signed)
  CRNA Pain Management Visit Note  Patient: Shari [name], 26 y.o., female  "Hello I am a member of the anesthesia team at [facility]. We have an anesthesia team available at all times to provide care throughout the hospital, including epidural management and anesthesia for C-section. I don't know your plan for the delivery whether it a natural birth, water birth, IV sedation, nitrous supplementation, doula or epidural, but we want to meet your pain goals."   1.Was your pain managed to your expectations on prior hospitalizations?   Yes   2.What is your expectation for pain management during this hospitalization?     Epidural  3.How can we help you reach that goal?   Record the patient's initial score and the patient's pain goal.   Pain: 4  Pain Goal: 5 [facility] wants you to be able to say your pain was always managed very well.  Laban Emperor 12/28/2016

## 2016-12-28 NOTE — Progress Notes (Signed)
Patient ID: Shari [name], female   DOB: 1990-12-16, 26 y.o.   MRN: [id number]  H&P reviewed, no changes  AFVSS gen NAD FHTs 145-150's, mod var, category 1 toco Q 2-74min  SVE 4/50/-2  AROM for clear fluid, w/o diff/comp  Continue IOL - cholestasis

## 2016-12-28 NOTE — Anesthesia Preprocedure Evaluation (Signed)
Anesthesia Evaluation  Patient identified by MRN, date of birth, ID band Patient awake    Reviewed: Allergy & Precautions, H&P , NPO status , Patient's Chart, lab work & pertinent test results  Airway Mallampati: I  TM Distance: >3 FB Neck ROM: full    Dental no notable dental hx.    Pulmonary neg pulmonary ROS,    Pulmonary exam normal        Cardiovascular negative cardio ROS Normal cardiovascular exam     Neuro/Psych negative neurological ROS  negative psych ROS   GI/Hepatic negative GI ROS, Neg liver ROS,   Endo/Other  negative endocrine ROS  Renal/GU negative Renal ROS     Musculoskeletal   Abdominal Normal abdominal exam  (+)   Peds  Hematology   Anesthesia Other Findings   Reproductive/Obstetrics (+) Pregnancy                             Anesthesia Physical Anesthesia Plan  ASA: II  Anesthesia Plan: Epidural   Post-op Pain Management:    Induction:   Airway Management Planned:   Additional Equipment:   Intra-op Plan:   Post-operative Plan:   Informed Consent: I have reviewed the patients History and Physical, chart, labs and discussed the procedure including the risks, benefits and alternatives for the proposed anesthesia with the patient or authorized representative who has indicated his/her understanding and acceptance.     Plan Discussed with:   Anesthesia Plan Comments:         Anesthesia Quick Evaluation  

## 2016-12-28 NOTE — Progress Notes (Signed)
Patient ID: Shari [name], female   DOB: 12/26/1990, 26 y.o.   MRN: [id number]   No c/o's, comf with epidural  AFVSS  gen NAD FHTs 145-150's mod var, category 1 toco Q  SVE 5/50/-2 IUPC placed w/o diff/comp  Continue IOL Expect SVD

## 2016-12-28 NOTE — Anesthesia Postprocedure Evaluation (Signed)
Anesthesia Post Note  Patient: Shari [name]  Procedure(s) Performed: * No procedures listed *  Patient location during evaluation: Mother Baby Anesthesia Type: Epidural Level of consciousness: awake and alert Pain management: satisfactory to patient Vital Signs Assessment: post-procedure vital signs reviewed and stable Respiratory status: respiratory function stable Cardiovascular status: stable Postop Assessment: no headache, no backache, epidural receding, patient able to bend at knees, no signs of nausea or vomiting and adequate PO intake Anesthetic complications: no        Last Vitals:  Vitals:   12/28/16 2000 12/28/16 2100  BP: 120/66 118/61  Pulse: 78 81  Resp: 18 18  Temp: 37.1 C 36.4 C    Last Pain:  Vitals:   12/28/16 2104  TempSrc:   PainSc: 0-No pain   Pain Goal: Patients Stated Pain Goal: 5 (12/28/16 1531)               Karleen Dolphin

## 2016-12-28 NOTE — Anesthesia Procedure Notes (Signed)
Epidural Patient location during procedure: OB Start time: 12/28/2016 9:32 AM End time: 12/28/2016 9:36 AM  Staffing Anesthesiologist: Leilani Able Performed: anesthesiologist   Preanesthetic Checklist Completed: patient identified, surgical consent, pre-op evaluation, timeout performed, IV checked, risks and benefits discussed and monitors and equipment checked  Epidural Patient position: sitting Prep: site prepped and draped and DuraPrep Patient monitoring: continuous pulse ox and blood pressure Approach: midline Location: L3-L4 Injection technique: LOR air  Needle:  Needle type: Tuohy  Needle gauge: 17 G Needle length: 9 cm and 9 Needle insertion depth: 5 cm cm Catheter type: closed end flexible Catheter size: 19 Gauge Catheter at skin depth: 10 cm Test dose: negative and Other  Assessment Sensory level: T9 Events: blood not aspirated, injection not painful, no injection resistance, negative IV test and no paresthesia  Additional Notes Reason for block:procedure for pain

## 2016-12-29 LAB — CBC
HCT: 31.9 % — ABNORMAL LOW (ref 36.0–46.0)
Hemoglobin: 11 g/dL — ABNORMAL LOW (ref 12.0–15.0)
MCH: 33.4 pg (ref 26.0–34.0)
MCHC: 34.5 g/dL (ref 30.0–36.0)
MCV: 97 fL (ref 78.0–100.0)
PLATELETS: 269 10*3/uL (ref 150–400)
RBC: 3.29 MIL/uL — ABNORMAL LOW (ref 3.87–5.11)
RDW: 14.3 % (ref 11.5–15.5)
WBC: 16.4 10*3/uL — ABNORMAL HIGH (ref 4.0–10.5)

## 2016-12-29 NOTE — Progress Notes (Signed)
PPD #1 No problems Afeb, VSS Fundus firm, NT at U-1 Continue routine postpartum care 

## 2016-12-29 NOTE — Lactation Note (Signed)
This note was copied from a baby's chart. Lactation Consultation Note  Patient Name: Shari [name] ZOXWR'U Date: 12/29/2016 Reason for consult: Initial assessment  Initial visit at 24 hours of age.  Baby has had 7 feedings with 6 voids and 5 stools.  Baby is [redacted]w[redacted]d at 8#0oz.  Mom is experienced with now 26 year old breast feeding for 2 months and then pumping for 4 more months.  Mom reports left breast surgery for milk cyst when baby was 6 months that led to weaning. Mom is eager to breast feed this baby and aware this baby is early term gestation.  LC discussed calling LC to assess for depth of latch and for good transfer.  Diaper output suggests good transfer at this time.  LC encouraged mom to work on hand expression several times daily to establish a good milk supply and to allow for spoon feeding EBM.  LC encouraged mom to wake baby as needed with appetizer spoon feeding and offer "easy" calories after latching with spoon feeding.  Mom aware how to spoon feed and has colostrum containers at bedside. Mom will focus on latching and hand expression and consider adding DEBP at needed. Baby asleep in moms arms.  Mom denies pain with latching or other concerns at this time.  Mom reports she is much more relaxed about breastfeeding this baby.   [facility] LC resources given and discussed.  Encouraged to feed with early cues on demand.  Early newborn behavior discussed.  Hand expression reported by mom with colostrum visible.  Mom to call [facility] for assist as needed and to update feeding plan as needed.       Maternal Data Has patient been taught Hand Expression?: Yes Does the patient have breastfeeding experience prior to this delivery?: Yes  Feeding    LATCH Score/Interventions                Intervention(s): Breastfeeding basics reviewed     Lactation Tools Discussed/Used     Consult Status Consult Status: Follow-up Date: 12/30/16 Follow-up type: In-patient    [name], [name] 12/29/2016, 8:14 PM

## 2016-12-30 MED ORDER — IBUPROFEN 600 MG PO TABS: 600.0000 mg | ORAL_TABLET | Freq: Four times a day (QID) | ORAL | 0 refills | Status: DC

## 2016-12-30 MED FILL — IBUPROFEN 600 MG TABLET: 600 | 7 days supply | Qty: 30 | Fill #0

## 2016-12-30 NOTE — Lactation Note (Signed)
This note was copied from a baby's chart. Lactation Consultation Note  Patient Name: Shari [name] ZOXWR'U Date: 12/30/2016 Reason for consult: Follow-up assessment  Baby 41 hours old. Baby latched initially and suckled a few times in bursts, but baby sleepy at the breast. Enc mom undress baby to nurse, but mom about to go to pick up DEBP. Enc parents to hand express and supplement with spoon/syringe. Parents comfortable with both methods. Discussed post-pumping if baby continues to be sleepy at the breast. Plan is for mom to put baby to breast with cues, then supplement according to guidelines--which were given with review. Enc mom to hand express after baby fed, and then if baby continues to be sleepy at the breast to start using DEBP. Mom aware of OP/BFSG and LC phone line assistance after D/C.    Maternal Data    Feeding Feeding Type: Breast Fed Length of feed: 0 min  LATCH Score/Interventions Latch: Too sleepy or reluctant, no latch achieved, no sucking elicited. Intervention(s):  (Enc undressing baby and nursing STS.) Intervention(s): Adjust position;Assist with latch;Breast compression  Audible Swallowing: None Intervention(s): Hand expression  Type of Nipple: Everted at rest and after stimulation  Comfort (Breast/Nipple): Soft / non-tender     Hold (Positioning): Assistance needed to correctly position infant at breast and maintain latch. Intervention(s): Breastfeeding basics reviewed;Support Pillows  LATCH Score: 5  Lactation Tools Discussed/Used     Consult Status Consult Status: PRN    Sherlyn Hay 12/30/2016, 11:07 AM

## 2016-12-30 NOTE — Discharge Summary (Signed)
OB Discharge Summary     Patient Name: Shari [name] DOB: [birth year] MRN: [id number]  Date of admission: 12/28/2016 Delivering MD: Sherian Rein   Date of discharge: 12/30/2016  Admitting diagnosis: 37.1wks, induction Intrauterine pregnancy: [redacted]w[redacted]d     Secondary diagnosis:  Principal Problem:   SVD (spontaneous vaginal delivery) Active Problems:   Cholestasis during pregnancy in third trimester  Additional problems: none     Discharge diagnosis: Term Pregnancy Delivered                                                                                                Post partum procedures:none  Augmentation: AROM and Pitocin  Complications: None  Hospital course:  Induction of Labor With Vaginal Delivery   26 y.o. yo [id number] at [redacted]w[redacted]d was admitted to the hospital 12/28/2016 for induction of labor.  Indication for induction: Cholestasis of pregnancy.  Patient had an uncomplicated labor course as follows: Membrane Rupture Time/Date: 8:50 AM ,12/28/2016   Intrapartum Procedures: Episiotomy: None [1]                                         Lacerations:  None [1]  Patient had delivery of a Viable infant.  Information for the patient's newborn:  Monserratt, Knezevic [[id number]]  Delivery Method: Vaginal, Spontaneous Delivery (Filed from Delivery Summary)   12/28/2016  Details of delivery can be found in separate delivery note.  Patient had a routine postpartum course. Patient is discharged home 12/30/16.  Physical exam  Vitals:   12/28/16 2100 12/29/16 0041 12/29/16 1949 12/30/16 0541  BP: 118/61 (!) 99/51 (!) 107/56 (!) 101/59  Pulse: 81 85 80 76  Resp: Temp: 97.5 F (36.4 C) 97.7 F (36.5 C) 98.1 F (36.7 C) 97.9 F (36.6 C)  TempSrc: Oral Oral Oral Oral  SpO2: 99% 99%    Weight:      Height:       General: alert and cooperative Lochia: appropriate Uterine Fundus: firm  Labs: Lab Results  Component Value Date   WBC 16.4 (H) 12/29/2016   HGB  11.0 (L) 12/29/2016   HCT 31.9 (L) 12/29/2016   MCV 97.0 12/29/2016   PLT 269 12/29/2016   CMP Latest Ref Rng & Units 06/28/2016  Glucose 65 - 99 mg/dL 130(Q)  BUN 6 - 20 mg/dL 10  Creatinine 6.57 - 8.46 mg/dL 9.62  Sodium 952 - 841 mmol/L 135  Potassium 3.5 - 5.1 mmol/L 3.2(L)  Chloride 101 - 111 mmol/L 105  CO2 22 - 32 mmol/L 21(L)  Calcium 8.9 - 10.3 mg/dL 9.0    Discharge instruction: per After Visit Summary and "Baby and Me Booklet".  After visit meds:  Allergies as of 12/30/2016   No Known Allergies     Medication List    STOP taking these medications   ursodiol 300 MG capsule Commonly known as:  ACTIGALL     TAKE these medications   acetaminophen 500 MG tablet Commonly known as:  TYLENOL Take 1,000 mg by mouth every 6 (six) hours as needed for moderate pain.   ibuprofen 600 MG tablet Commonly known as:  ADVIL,MOTRIN Take 1 tablet (600 mg total) by mouth every 6 (six) hours.   prenatal multivitamin Tabs tablet Take 1 tablet by mouth daily at 12 noon.   triamcinolone cream 0.5 % Commonly known as:  KENALOG Apply 1 application topically 2 (two) times daily as needed (for itchy rash).       Diet: routine diet  Activity: Advance as tolerated. Pelvic rest for 6 weeks.   Outpatient follow up:2 weeks Follow up Appt:No future appointments. Follow up Visit:No Follow-up on file.  Postpartum contraception: IUD Mirena  Newborn Data: Live born female  Birth Weight: 8 lb 0.9 oz (3655 g) APGAR: 9, 9  Baby Feeding: Breast Disposition:home with mother   12/30/2016 Oliver Pila, MD

## 2016-12-30 NOTE — Lactation Note (Signed)
This note was copied from a baby's chart. Lactation Consultation Note  Patient Name: Shari [name] WGNFA'O Date: 12/30/2016   Baby 39 hours old. Mom reports that baby sleepy at breast. Discussed infant gestation and behavior. Enc mom to hand express and spoon-feed after each feeding in order to make sure baby getting enough and to enc mom's milk to come to volume. Enc mom to call for LC to see latch at next feeding to assess for milk transfer.   Maternal Data    Feeding Feeding Type: Breast Fed Length of feed: 15 min  LATCH Score/Interventions                      Lactation Tools Discussed/Used     Consult Status      Shari [name] 12/30/2016, 9:46 AM

## 2016-12-30 NOTE — Progress Notes (Signed)
Post Partum Day 2 Subjective: no complaints, up ad lib and tolerating PO  Objective: Blood pressure (!) 101/59, pulse 76, temperature 97.9 F (36.6 C), temperature source Oral, resp. rate 18, height  (1.575 m), weight 70.3 kg (155 lb), last menstrual period 04/12/2016, SpO2 99 %, unknown if currently breastfeeding.  Physical Exam:  General: alert and cooperative Lochia: appropriate Uterine Fundus: firm    Recent Labs  12/28/16 0830 12/29/16 0511  HGB 12.2 11.0*  HCT 36.0 31.9*    Assessment/Plan: Discharge home   LOS: 2 days   Oliver Pila 12/30/2016, 9:43 AM

## 2016-12-31 LAB — TYPE AND SCREEN
ABO/RH(D): AB POS
ANTIBODY SCREEN: POSITIVE
DAT, IGG: NEGATIVE
DONOR AG TYPE: NEGATIVE
Donor AG Type: NEGATIVE
PT AG TYPE: NEGATIVE
UNIT DIVISION: 0
Unit division: 0

## 2016-12-31 LAB — BPAM RBC
BLOOD PRODUCT EXPIRATION DATE: 201804222359
Blood Product Expiration Date: 201804222359
UNIT TYPE AND RH: 6200
UNIT TYPE AND RH: 6200

## 2017-01-17 ENCOUNTER — Inpatient Hospital Stay (HOSPITAL_COMMUNITY): Admission: AD | Admit: 2017-01-17 | Payer: 59 | Source: Ambulatory Visit | Admitting: Obstetrics and Gynecology

## 2017-02-02 DIAGNOSIS — Z3A37 37 weeks gestation of pregnancy: Secondary | ICD-10-CM | POA: Diagnosis not present

## 2017-02-02 DIAGNOSIS — O26613 Liver and biliary tract disorders in pregnancy, third trimester: Secondary | ICD-10-CM | POA: Diagnosis not present

## 2017-02-08 DIAGNOSIS — Z3202 Encounter for pregnancy test, result negative: Secondary | ICD-10-CM | POA: Diagnosis not present

## 2017-02-08 DIAGNOSIS — Z30430 Encounter for insertion of intrauterine contraceptive device: Secondary | ICD-10-CM | POA: Diagnosis not present

## 2017-02-08 DIAGNOSIS — Z3009 Encounter for other general counseling and advice on contraception: Secondary | ICD-10-CM | POA: Diagnosis not present

## 2017-04-13 DIAGNOSIS — M6208 Separation of muscle (nontraumatic), other site: Secondary | ICD-10-CM | POA: Diagnosis not present

## 2017-04-13 DIAGNOSIS — Z30431 Encounter for routine checking of intrauterine contraceptive device: Secondary | ICD-10-CM | POA: Diagnosis not present

## 2017-06-01 DIAGNOSIS — N6321 Unspecified lump in the left breast, upper outer quadrant: Secondary | ICD-10-CM | POA: Diagnosis not present

## 2017-06-15 DIAGNOSIS — Z01419 Encounter for gynecological examination (general) (routine) without abnormal findings: Secondary | ICD-10-CM | POA: Diagnosis not present

## 2017-06-15 DIAGNOSIS — Z30431 Encounter for routine checking of intrauterine contraceptive device: Secondary | ICD-10-CM | POA: Diagnosis not present

## 2017-06-15 DIAGNOSIS — Z130 Encounter for screening for diseases of the blood and blood-forming organs and certain disorders involving the immune mechanism: Secondary | ICD-10-CM | POA: Diagnosis not present

## 2017-06-15 DIAGNOSIS — M6208 Separation of muscle (nontraumatic), other site: Secondary | ICD-10-CM | POA: Diagnosis not present

## 2017-06-15 DIAGNOSIS — Z1389 Encounter for screening for other disorder: Secondary | ICD-10-CM | POA: Diagnosis not present

## 2017-06-15 DIAGNOSIS — R32 Unspecified urinary incontinence: Secondary | ICD-10-CM | POA: Diagnosis not present

## 2017-06-23 ENCOUNTER — Encounter: Payer: Self-pay | Admitting: Obstetrics and Gynecology

## 2017-06-23 ENCOUNTER — Ambulatory Visit (INDEPENDENT_AMBULATORY_CARE_PROVIDER_SITE_OTHER): Payer: 59 | Admitting: Obstetrics and Gynecology

## 2017-06-23 VITALS — BP 118/62 | HR 84 | Resp 14 | Wt 113.0 lb

## 2017-06-23 DIAGNOSIS — N6321 Unspecified lump in the left breast, upper outer quadrant: Secondary | ICD-10-CM | POA: Diagnosis not present

## 2017-06-23 NOTE — Progress Notes (Signed)
26 y.o. [id number] MarriedCaucasianF here for left breast mass. She is 6 months PP, nursing (only pumping). She c/o a left breast lump, she first noticed it 3 weeks ago. Can't always feel the lump, but tends to find it easier after she pumps. She had her annual last week with her OB, neither her OB or she could find the lump at that time. It has been tender off and on. She had a galactocele in the left breast after the birth of her last baby, in a different area of the breast.     No LMP recorded.          Sexually active: Yes.    The current method of family planning is IUD.    Exercising: No.  The patient does not participate in regular exercise at present. Smoker:  no  Health Maintenance: Pap:  06-16-15 WNL  History of abnormal Pap:  no MMG:  Never TDaP:  12-19-13 Gardasil: no   reports that she has never smoked. She has never used smokeless tobacco. She reports that she does not drink alcohol or use drugs.  Past Medical History:  Diagnosis Date  . Anemia    after delivery  . Cholestasis during pregnancy   . Medical history non-contributory   . Normal pregnancy 04/18/2014  . SVD (spontaneous vaginal delivery) 04/19/2014  . SVD (spontaneous vaginal delivery) 12/28/2016    Past Surgical History:  Procedure Laterality Date  . BREAST SURGERY Left    cyst aspiration  . WISDOM TOOTH EXTRACTION      Current Outpatient Prescriptions  Medication Sig Dispense Refill  . ibuprofen (ADVIL,MOTRIN) 600 MG tablet Take 1 tablet (600 mg total) by mouth every 6 (six) hours. 30 tablet 0  . Prenatal Vit-Fe Fumarate-FA (PRENATAL MULTIVITAMIN) TABS tablet Take 1 tablet by mouth daily at 12 noon. 30 tablet 12  . levonorgestrel (MIRENA, 52 MG,) 20 MCG/24HR IUD Mirena 20 mcg/24 hr (5 years) intrauterine device     No current facility-administered medications for this visit.     Family History  Problem Relation Age of Onset  . Heart disease Father   . Hashimoto's thyroiditis Sister   . Heart disease  Maternal Grandfather     Review of Systems  Constitutional: Negative.   HENT: Negative.   Eyes: Negative.   Respiratory: Negative.   Cardiovascular: Negative.   Gastrointestinal: Negative.   Endocrine: Negative.   Genitourinary:       Left breast mass  Musculoskeletal: Negative.   Skin: Negative.   Allergic/Immunologic: Negative.   Neurological: Negative.   Hematological: Negative.   Psychiatric/Behavioral: Negative.     Exam:   BP 118/62 (BP Location: Right Arm, Patient Position: Sitting, Cuff Size: Normal)   Pulse 84   Resp 14   Wt 113 lb (51.3 kg)   Breastfeeding? Yes   BMI 20.67 kg/m   Weight change: @ Height:      Ht Readings from Last 3 Encounters:  12/28/16  (1.575 m)  08/25/15 5' 3.5" (1.613 m)  04/19/14  (1.575 m)    General appearance: alert, cooperative and appears stated age Breasts: examined after she pumped. In the left breast there is a bean shaped lump at 2 o'clock, angled from her axilla toward her nipple, 3 cm x 8 mm, not tender Chaperone was present for exam.  A:  Left breast lump 2 o'clock  P:   Left breast ultrasound

## 2017-06-24 ENCOUNTER — Ambulatory Visit
Admission: RE | Admit: 2017-06-24 | Discharge: 2017-06-24 | Disposition: A | Discharge status: Home / Self Care | Payer: 59 | Source: Ambulatory Visit | Attending: Obstetrics and Gynecology | Admitting: Obstetrics and Gynecology

## 2017-06-24 DIAGNOSIS — N6489 Other specified disorders of breast: Secondary | ICD-10-CM | POA: Diagnosis not present

## 2017-06-24 DIAGNOSIS — N6321 Unspecified lump in the left breast, upper outer quadrant: Secondary | ICD-10-CM

## 2017-11-18 ENCOUNTER — Encounter: Payer: Self-pay | Admitting: Nurse Practitioner

## 2017-11-18 ENCOUNTER — Ambulatory Visit (INDEPENDENT_AMBULATORY_CARE_PROVIDER_SITE_OTHER): Payer: Self-pay | Admitting: Nurse Practitioner

## 2017-11-18 VITALS — BP 100/70 | HR 103 | Temp 98.4°F | Wt 113.2 lb

## 2017-11-18 DIAGNOSIS — J329 Chronic sinusitis, unspecified: Secondary | ICD-10-CM

## 2017-11-18 DIAGNOSIS — B9789 Other viral agents as the cause of diseases classified elsewhere: Secondary | ICD-10-CM

## 2017-11-18 MED ORDER — FLUTICASONE PROPIONATE 50 MCG/ACT NA SUSP: 2.0000 | Freq: Every day | NASAL | 0 refills | Status: DC

## 2017-11-18 NOTE — Progress Notes (Signed)
Subjective:  Shari [name] is a 27 y.o. female who presents for evaluation of possible sinusitis.  Symptoms include bilateral ear pressure/pain, nasal congestion, post nasal drip and sinus pressure.  Onset of symptoms was 1 day ago, and has been gradually worsening since that time.  Treatment to date:  none.  High risk factors for influenza complications:  none.  The following portions of the patient's history were reviewed and updated as appropriate:  allergies, current medications and past medical history.  Constitutional: positive for fatigue and sweats, negative for anorexia, chills and malaise Eyes: negative Ears, nose, mouth, throat, and face: positive for nasal congestion and sore throat, negative for ear drainage and earaches Respiratory: negative Cardiovascular: negative Gastrointestinal: negative Neurological: positive for headaches, negative for coordination problems, dizziness, paresthesia, tremors and weakness Allergic/Immunologic: positive for hay fever, negative for anaphylaxis and angioedema Objective:  BP 100/70   Pulse (!) 103   Temp 98.4 F (36.9 C)   Wt 113 lb 3.2 oz (51.3 kg)   SpO2 99%   BMI 20.70 kg/m  General appearance: alert, cooperative and no distress Head: Normocephalic, without obvious abnormality, atraumatic Eyes: conjunctivae/corneas clear. PERRL, EOM's intact. Fundi benign. Ears: normal TM's and external ear canals both ears Nose: no discharge, turbinates swollen, inflamed, mild maxillary sinus tenderness bilateral, mild frontal sinus tenderness bilateral Throat: lips, mucosa, and tongue normal; teeth and gums normal Lungs: clear to auscultation bilaterally Heart: regular rate and rhythm, S1, S2 normal, no murmur, click, rub or gallop Abdomen: soft, non-tender; bowel sounds normal; no masses,  no organomegaly Pulses: 2+ and symmetric Skin: Skin color, texture, turgor normal. No rashes or lesions Lymph nodes: Cervical, supraclavicular, and axillary  nodes normal. and cervical and submandibular nodes normal Neurologic: Grossly normal    Assessment:  Viral Sinusitis    Plan:  Discussed the importance of avoiding unnecessary antibiotic therapy. Educational material distributed and questions answered. Suggested symptomatic OTC remedies. Supportive care with appropriate antipyretics and fluids. Nasal saline spray for congestion. Flonase per orders. Nasal steroids per orders. Follow up as needed.  Patient will follow up if symptoms persist.

## 2017-11-18 NOTE — Patient Instructions (Addendum)

## 2018-02-16 ENCOUNTER — Ambulatory Visit (INDEPENDENT_AMBULATORY_CARE_PROVIDER_SITE_OTHER): Admitting: Family Medicine

## 2018-02-16 ENCOUNTER — Encounter: Payer: Self-pay | Admitting: Family Medicine

## 2018-02-16 VITALS — BP 121/81 | HR 89 | Temp 98.2°F | Resp 20 | Ht 62.0 in | Wt 115.0 lb

## 2018-02-16 DIAGNOSIS — Z1329 Encounter for screening for other suspected endocrine disorder: Secondary | ICD-10-CM | POA: Diagnosis not present

## 2018-02-16 DIAGNOSIS — R4582 Worries: Secondary | ICD-10-CM | POA: Diagnosis not present

## 2018-02-16 DIAGNOSIS — K625 Hemorrhage of anus and rectum: Secondary | ICD-10-CM

## 2018-02-16 DIAGNOSIS — Z13220 Encounter for screening for lipoid disorders: Secondary | ICD-10-CM

## 2018-02-16 DIAGNOSIS — R109 Unspecified abdominal pain: Secondary | ICD-10-CM | POA: Diagnosis not present

## 2018-02-16 DIAGNOSIS — Z131 Encounter for screening for diabetes mellitus: Secondary | ICD-10-CM

## 2018-02-16 LAB — COMPREHENSIVE METABOLIC PANEL
ALT: 8 U/L (ref 0–35)
AST: 13 U/L (ref 0–37)
Albumin: 4.7 g/dL (ref 3.5–5.2)
Alkaline Phosphatase: 19 U/L — ABNORMAL LOW (ref 39–117)
BILIRUBIN TOTAL: 0.6 mg/dL (ref 0.2–1.2)
BUN: 12 mg/dL (ref 6–23)
CO2: 25 meq/L (ref 19–32)
CREATININE: 0.77 mg/dL (ref 0.40–1.20)
Calcium: 9.3 mg/dL (ref 8.4–10.5)
Chloride: 104 mEq/L (ref 96–112)
GFR: 93.99 mL/min (ref >60.00)
GLUCOSE: 93 mg/dL (ref 70–99)
Potassium: 4 mEq/L (ref 3.5–5.1)
Sodium: 140 mEq/L (ref 135–145)
Total Protein: 8 g/dL (ref 6.0–8.3)

## 2018-02-16 LAB — CBC WITH DIFFERENTIAL/PLATELET
BASOS ABS: 0.1 10*3/uL (ref 0.0–0.1)
Basophils Relative: 0.9 % (ref 0.0–3.0)
EOS ABS: 0 10*3/uL (ref 0.0–0.7)
Eosinophils Relative: 0.3 % (ref 0.0–5.0)
HCT: 39.1 % (ref 36.0–46.0)
Hemoglobin: 13.5 g/dL (ref 12.0–15.0)
LYMPHS PCT: 17.1 % (ref 12.0–46.0)
Lymphs Abs: 1.4 10*3/uL (ref 0.7–4.0)
MCHC: 34.5 g/dL (ref 30.0–36.0)
MCV: 92.2 fl (ref 78.0–100.0)
Monocytes Absolute: 0.3 10*3/uL (ref 0.1–1.0)
Monocytes Relative: 4 % (ref 3.0–12.0)
NEUTROS ABS: 6.2 10*3/uL (ref 1.4–7.7)
NEUTROS PCT: 77.7 % — AB (ref 43.0–77.0)
PLATELETS: 325 10*3/uL (ref 150.0–400.0)
RBC: 4.24 Mil/uL (ref 3.87–5.11)
RDW: 12.3 % (ref 11.5–15.5)
WBC: 7.9 10*3/uL (ref 4.0–10.5)

## 2018-02-16 LAB — HEMOGLOBIN A1C: HEMOGLOBIN A1C: 5.1 % (ref 4.6–6.5)

## 2018-02-16 LAB — TSH: TSH: 1.79 u[IU]/mL (ref 0.35–4.50)

## 2018-02-16 LAB — LIPID PANEL
CHOL/HDL RATIO: 3
Cholesterol: 164 mg/dL (ref 0–200)
HDL: 63.5 mg/dL (ref >39.00)
LDL CALC: 92 mg/dL (ref 0–99)
NonHDL: 100.4
Triglycerides: 40 mg/dL (ref 0.0–149.0)
VLDL: 8 mg/dL (ref 0.0–40.0)

## 2018-02-16 LAB — POC HEMOCCULT BLD/STL (OFFICE/1-CARD/DIAGNOSTIC): Fecal Occult Blood, POC: NEGATIVE

## 2018-02-16 LAB — C-REACTIVE PROTEIN: CRP: 0.2 mg/dL — ABNORMAL LOW (ref 0.5–20.0)

## 2018-02-16 NOTE — Patient Instructions (Signed)
We will collect your labs today and review them at your physical next week.  I believe your discomfort is from stool burden. Try miralax 1/2 cap in 8 ounces of water daily to encourage more movement.  Increase water consumption to 80 ounces total a day.        Follow these instructions at home: Eating and drinking   Eat foods that have a lot of fiber, such as: ? Fresh fruits and vegetables. ? Whole grains. ? Beans.  Eat less of foods that are high in fat, low in fiber, or overly processed, such as: ? [place] fries. ? Hamburgers. ? Cookies. ? Candy. ? Soda.  Drink enough fluid to keep your pee (urine) clear or pale yellow. General instructions  Exercise regularly or as told by your doctor.  Go to the restroom when you feel like you need to poop. Do not hold it in.  Take over-the-counter and prescription medicines only as told by your doctor. These include any fiber supplements.  Do pelvic floor retraining exercises, such as: ? Doing deep breathing while relaxing your lower belly (abdomen). ? Relaxing your pelvic floor while pooping.  Watch your condition for any changes.  Keep all follow-up visits as told by your doctor. This is important. Contact a doctor if:  You have pain that gets worse.  You have a fever.  You have not pooped for 4 days.  You throw up (vomit).  You are not hungry.  You lose weight.  You are bleeding from the anus.  You have thin, pencil-like poop (stool). Get help right away if:  You have a fever, and your symptoms suddenly get worse.  You leak poop or have blood in your poop.  Your belly feels hard or bigger than normal (is bloated).  You have very bad belly pain.  You feel dizzy or you faint. This information is not intended to replace advice given to you by your health care provider. Make sure you discuss any questions you have with your health care provider. Document Released: 02/23/2008 Document Revised: 03/26/2016  Document Reviewed: 02/25/2016 Elsevier Interactive Patient Education  2018 [employer].

## 2018-02-16 NOTE — Progress Notes (Signed)
Shari [name] , 01/15/1991, 27 y.o., female MRN: [id number] Patient Care Team    Relationship Specialty Notifications Start End  Ma Hillock, DO PCP - General Family Medicine  08/22/15     Chief Complaint  Patient presents with  . Blood In Stools  . Abdominal Pain    intermittent x 1 week     Subjective: Pt presents for an OV with complaints of abdominal discomfort intermittently of 1 week duration.  Associated symptoms include possible bloody stool.  She states over the last week she has intermittently felt right-sided abdominal discomfort, which she describes as soreness or feels like a bruise if touched.  She reports normal bowel movement frequencies which occur approximately every day over the course of this time.  On Wednesday she noticed a possible red area in her stool, which she was unsure if it was blood or the tomatoes she had been eating.  She denies any dizziness, palpitations, increased fatigue or fever.  She did have 2 vaginal births, but does not believe she suffered from hemorrhoids in the past.  She denies pain with defecation.  SHe denies straining to have a bowel movement.  SHe has many worries surrounding the possibility of having a hernia.  She reports she has increased anxiety and can panic anyway, so she is uncertain if she is making more out of this than it is.  No flowsheet data found.  No Known Allergies Social History   Tobacco Use  . Smoking status: Never Smoker  . Smokeless tobacco: Never Used  Substance Use Topics  . Alcohol use: No   Past Medical History:  Diagnosis Date  . Anemia    after delivery  . Cholestasis during pregnancy   . Medical history non-contributory   . Normal pregnancy 04/18/2014  . SVD (spontaneous vaginal delivery) 04/19/2014  . SVD (spontaneous vaginal delivery) 12/28/2016   Past Surgical History:  Procedure Laterality Date  . BREAST SURGERY Left    cyst aspiration  . WISDOM TOOTH EXTRACTION     Family History    Problem Relation Age of Onset  . Heart disease Father   . Hashimoto's thyroiditis Sister   . Heart disease Maternal Grandfather    Allergies as of 02/16/2018   No Known Allergies     Medication List        Accurate as of 02/16/18  9:22 AM. Always use your most recent med list.          fluticasone 50 MCG/ACT nasal spray Commonly known as:  FLONASE Place 2 sprays into both nostrils daily for 10 days.   ibuprofen 600 MG tablet Commonly known as:  ADVIL,MOTRIN Take 1 tablet (600 mg total) by mouth every 6 (six) hours.   MIRENA (52 MG) 20 MCG/24HR IUD Generic drug:  levonorgestrel Mirena 20 mcg/24 hr (5 years) intrauterine device   prenatal multivitamin Tabs tablet Take 1 tablet by mouth daily at 12 noon.       All past medical history, surgical history, allergies, family history, immunizations andmedications were updated in the EMR today and reviewed under the history and medication portions of their EMR.     ROS: Negative, with the exception of above mentioned in HPI   Objective:  BP 121/81 (BP Location: Left Arm, Patient Position: Sitting, Cuff Size: Normal)   Pulse 89   Temp 98.2 F (36.8 C)   Resp 20   Ht '5\' 2"'  (1.575 m)   Wt 115 lb (52.2 kg)  SpO2 (!) 89%   Breastfeeding? No   BMI 21.03 kg/m  Body mass index is 21.03 kg/m. Gen: Afebrile. No acute distress. Nontoxic in appearance, well developed, well nourished.  HENT: AT. [place]. MMM Eyes:Pupils Equal Round Reactive to light, Extraocular movements intact,  Conjunctiva without redness, discharge or icterus. CV: RRR  Chest: CTAB, no wheeze or crackles.   Abd: Soft.  Flat. NTND. BS present.  No masses palpated.  Diastases recti present.  Moderate stool burden palpated.  No rebound or guarding.  Negative McBurney's point, negative Murphy sign Skin: no rashes, purpura or petechiae.  Neuro: Normal gait. PERLA. EOMi. Alert. Oriented x3  Psych: Mildly anxious, otherwise normal affect, dress and demeanor. Normal  speech. Normal thought content and judgment.  No exam data present No results found. No results found for this or any previous visit (from the past 24 hour(s)).  Assessment/Plan: ERMINE STEBBINS is a 27 y.o. female present for OV for  Abdominal pain, unspecified abdominal location/Rectal bleeding -Patient's exam was benign today.  Believe she has moderate stool burden and her discomfort may be from stool burden or irritation from eating a bunch of tomatoes.  discussed with her diastasis recti and increase her chances of having a hernia, cause similar discomfort if: Becomes entrapped.  Her exam today is not consistent with that today. - CBC w/Diff - Comp Met (CMET) - C-reactive protein - POC Hemoccult Bld/Stl (1-Cd Office Dx)--> negative -  start MiraLAX taper, this was discussed in detail today with goal to have soft daily bowel movements. -Increase water consumption to 80 ounces a day. -Has CPE next week we will follow-up briefly at that visit. Labs placed for CPE next week. Will be addressed at physical.  Lipid screening - Lipid panel Diabetes mellitus screening - HgB A1c Thyroid disorder screen/[place] - TSH     Reviewed expectations re: course of current medical issues.  Discussed self-management of symptoms.  Outlined signs and symptoms indicating need for more acute intervention.  Patient verbalized understanding and all questions were answered.  Patient received an After-Visit Summary.    No orders of the defined types were placed in this encounter.    Note is dictated utilizing voice recognition software. Although note has been proof read prior to signing, occasional typographical errors still can be missed. If any questions arise, please do not hesitate to call for verification.   electronically signed by:  [name], DO  [place]

## 2018-02-17 ENCOUNTER — Telehealth: Payer: Self-pay | Admitting: Family Medicine

## 2018-02-17 NOTE — Telephone Encounter (Signed)
Please inform patient the following information: Her labs concerning her stomach discomfort are normal with the exception of low alk phos which is usually a nutritional deficit, zinc is common cause.  Zinc is an essential mineral, and eating enough is important for maintaining good health. The best way to ensure you are getting enough is to eat a varied diet with good sources of zinc, such as meat, seafood, nuts, seeds, legumes and dairy.

## 2018-02-17 NOTE — Telephone Encounter (Signed)
Left detailed message with results and instructions on patient voice mail per DPR 

## 2018-03-01 ENCOUNTER — Ambulatory Visit (INDEPENDENT_AMBULATORY_CARE_PROVIDER_SITE_OTHER): Admitting: Family Medicine

## 2018-03-01 ENCOUNTER — Encounter: Payer: Self-pay | Admitting: Family Medicine

## 2018-03-01 VITALS — BP 114/76 | HR 76 | Temp 98.0°F | Resp 20 | Ht 62.0 in | Wt 115.0 lb

## 2018-03-01 DIAGNOSIS — Z0000 Encounter for general adult medical examination without abnormal findings: Secondary | ICD-10-CM | POA: Diagnosis not present

## 2018-03-01 DIAGNOSIS — Z975 Presence of (intrauterine) contraceptive device: Secondary | ICD-10-CM | POA: Diagnosis not present

## 2018-03-01 DIAGNOSIS — M6208 Separation of muscle (nontraumatic), other site: Secondary | ICD-10-CM | POA: Diagnosis not present

## 2018-03-01 HISTORY — DX: Separation of muscle (nontraumatic), other site: M62.08

## 2018-03-01 NOTE — Progress Notes (Signed)
Patient ID: Shari [name], female  DOB: [birth year], 27 y.o.   MRN: [id number] Patient Care Team    Relationship Specialty Notifications Start End  Ma Hillock, DO PCP - General Family Medicine  08/22/15   [name], MD Consulting Physician Obstetrics and Gynecology  03/01/18     Chief Complaint  Patient presents with  . Annual Exam    Subjective:  Shari [name] is a 27 y.o.  Female  present for CPE. All past medical history, surgical history, allergies, family history, immunizations, medications and social history were updated in the electronic medical record today. All recent labs, ED visits and hospitalizations within the last year were reviewed.  Labs were collected fasting prior to appt. and results reviewed with her today  Her abdomen is better and she is using the miralax daily.   Health maintenance:  Colonoscopy: No FHX, routine screen at 48 Mammogram: No fhx, routine screen at 50, Has OB/GYN Cervical cancer screening: UTD 2016, Follows with Ob/GYN, no abnl PAP Immunizations: Flu UTD through [facility]. Tdap UTD 2015 Infectious disease screening: HIV screen completed, Negative Assistive device: none Oxygen [id number] Patient has a Dental home. Hospitalizations/ED visits: reviewed   Depression screen [facility] 2/9 03/01/2018 02/16/2018  Decreased Interest 0 0  Down, Depressed, Hopeless 0 0  PHQ - 2 Score 0 0   No flowsheet data found.   Immunization History  Administered Date(s) Administered  . Influenza-Unspecified 04/21/2015, 06/20/2017  . MMR 04/20/2014  . Tdap 12/19/2013, 01/29/2014     Past Medical History:  Diagnosis Date  . Anemia    after delivery  . Cholestasis during pregnancy   . Medical history non-contributory   . Normal pregnancy 04/18/2014  . SVD (spontaneous vaginal delivery) 04/19/2014  . SVD (spontaneous vaginal delivery) 12/28/2016   No Known Allergies Past Surgical History:  Procedure Laterality Date  . BREAST SURGERY Left      cyst aspiration  . WISDOM TOOTH EXTRACTION     Family History  Problem Relation Age of Onset  . Heart disease Father   . Hashimoto's thyroiditis Sister   . Heart disease Maternal Grandfather    Social History   Socioeconomic History  . Marital status: Married    Spouse name: Not on file  . Number of children: Not on file  . Years of education: Not on file  . Highest education level: Not on file  Occupational History  . Not on file  Social Needs  . Financial resource strain: Not on file  . Food insecurity:    Worry: Not on file    Inability: Not on file  . Transportation needs:    Medical: Not on file    Non-medical: Not on file  Tobacco Use  . Smoking status: Never Smoker  . Smokeless tobacco: Never Used  Substance and Sexual Activity  . Alcohol use: No  . Drug use: No  . Sexual activity: Yes    Birth control/protection: None  Lifestyle  . Physical activity:    Days per week: Not on file    Minutes per session: Not on file  . Stress: Not on file  Relationships  . Social connections:    Talks on phone: Not on file    Gets together: Not on file    Attends religious service: Not on file    Active member of club or organization: Not on file    Attends meetings of clubs or organizations: Not on file    Relationship  status: Not on file  . Intimate partner violence:    Fear of current or ex partner: Not on file    Emotionally abused: Not on file    Physically abused: Not on file    Forced sexual activity: Not on file  Other Topics Concern  . Not on file  Social History Narrative   Married. Husband's names Darnelle Maffucci. One child (female) [name].   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 Takes a daily vitamin.   Wears her seatbelt. Smoke detector located in the home. Firearms located in the home and a locked.   Feels safe in her relationships.    Allergies as of 03/01/2018   No Known Allergies     Medication List        Accurate  as of 03/01/18  1:46 PM. Always use your most recent med list.          fluticasone 50 MCG/ACT nasal spray Commonly known as:  FLONASE Place 2 sprays into both nostrils daily for 10 days.   ibuprofen 600 MG tablet Commonly known as:  ADVIL,MOTRIN Take 1 tablet (600 mg total) by mouth every 6 (six) hours.   MIRENA (52 MG) 20 MCG/24HR IUD Generic drug:  levonorgestrel Mirena 20 mcg/24 hr (5 years) intrauterine device   prenatal multivitamin Tabs tablet Take 1 tablet by mouth daily at 12 noon.       All past medical history, surgical history, allergies, family history, immunizations andmedications were updated in the EMR today and reviewed under the history and medication portions of their EMR.     Recent Results (from the past 2160 hour(s))  POC Hemoccult Bld/Stl (1-Cd Office Dx)     Status: Normal   Collection Time: 02/16/18  9:40 AM  Result Value Ref Range   Card #1 Date 02/16/18    Fecal Occult Blood, POC Negative Negative  CBC w/Diff     Status: Abnormal   Collection Time: 02/16/18  9:45 AM  Result Value Ref Range   WBC 7.9 4.0 - 10.5 K/uL   RBC 4.24 3.87 - 5.11 Mil/uL   Hemoglobin 13.5 12.0 - 15.0 g/dL   HCT 39.1 36.0 - 46.0 %   MCV 92.2 78.0 - 100.0 fl   MCHC 34.5 30.0 - 36.0 g/dL   RDW 12.3 11.5 - 15.5 %   Platelets 325.0 150.0 - 400.0 K/uL   Neutrophils Relative % 77.7 (H) 43.0 - 77.0 %   Lymphocytes Relative 17.1 12.0 - 46.0 %   Monocytes Relative 4.0 3.0 - 12.0 %   Eosinophils Relative 0.3 0.0 - 5.0 %   Basophils Relative 0.9 0.0 - 3.0 %   Neutro Abs 6.2 1.4 - 7.7 K/uL   Lymphs Abs 1.4 0.7 - 4.0 K/uL   Monocytes Absolute 0.3 0.1 - 1.0 K/uL   Eosinophils Absolute 0.0 0.0 - 0.7 K/uL   Basophils Absolute 0.1 0.0 - 0.1 K/uL  Comp Met (CMET)     Status: Abnormal   Collection Time: 02/16/18  9:45 AM  Result Value Ref Range   Sodium 140 135 - 145 mEq/L   Potassium 4.0 3.5 - 5.1 mEq/L   Chloride 104 96 - 112 mEq/L   CO2 25 19 - 32 mEq/L   Glucose, Bld 93 70 -  99 mg/dL   BUN 12 6 - 23 mg/dL   Creatinine, Ser 0.77 0.40 - 1.20 mg/dL   Total Bilirubin 0.6 0.2 - 1.2 mg/dL   Alkaline Phosphatase  19 (L) 39 - 117 U/L   AST 13 0 - 37 U/L   ALT 8 0 - 35 U/L   Total Protein 8.0 6.0 - 8.3 g/dL   Albumin 4.7 3.5 - 5.2 g/dL   Calcium 9.3 8.4 - 10.5 mg/dL   GFR 93.99 >60.00 mL/min  C-reactive protein     Status: Abnormal   Collection Time: 02/16/18  9:45 AM  Result Value Ref Range   CRP 0.2 (L) 0.5 - 20.0 mg/dL  Lipid panel     Status: None   Collection Time: 02/16/18  9:45 AM  Result Value Ref Range   Cholesterol 164 0 - 200 mg/dL    Comment: ATP III Classification       Desirable:  < 200 mg/dL               Borderline High:  200 - 239 mg/dL          High:  > = 240 mg/dL   Triglycerides 40.0 0.0 - 149.0 mg/dL    Comment: Normal:  <150 mg/dLBorderline High:  150 - 199 mg/dL   HDL 63.50 >39.00 mg/dL   VLDL 8.0 0.0 - 40.0 mg/dL   LDL Cholesterol 92 0 - 99 mg/dL   Total CHOL/HDL Ratio 3     Comment:                Men          Women1/2 Average Risk     3.4          3.3Average Risk          5.0          4.42X Average Risk          9.6          7.13X Average Risk          15.0          11.0                       NonHDL 100.40     Comment: NOTE:  Non-HDL goal should be 30 mg/dL higher than patient's LDL goal (i.e. LDL goal of < 70 mg/dL, would have non-HDL goal of < 100 mg/dL)  HgB A1c     Status: None   Collection Time: 02/16/18  9:45 AM  Result Value Ref Range   Hgb A1c MFr Bld 5.1 4.6 - 6.5 %    Comment: Glycemic Control Guidelines for People with Diabetes:Non Diabetic:  <6%Goal of Therapy: <7%Additional Action Suggested:  >8%   TSH     Status: None   Collection Time: 02/16/18  9:45 AM  Result Value Ref Range   TSH 1.79 0.35 - 4.50 uIU/mL   ROS: 14 pt review of systems performed and negative (unless mentioned in an HPI)  Objective: BP 114/76 (BP Location: Right Arm, Patient Position: Sitting, Cuff Size: Normal)   Pulse 76   Temp 98 F (36.7 C)    Resp 20   Ht '5\' 2"'  (1.575 m)   Wt 115 lb (52.2 kg)   SpO2 100%   BMI 21.03 kg/m  Gen: Afebrile. No acute distress. Nontoxic in appearance, well-developed, well-nourished,  Pleasant caucasian female.  HENT: AT. [place]. Bilateral TM visualized and normal in appearance, normal external auditory canal. MMM, no oral lesions, adequate dentition. Bilateral nares within normal limits. Throat without erythema, ulcerations or exudates. no Cough on exam, no hoarseness on exam. Eyes:Pupils Equal Round  Reactive to light, Extraocular movements intact,  Conjunctiva without redness, discharge or icterus. Neck/lymp/endocrine: Supple,no lymphadenopathy, no thyromegaly CV: RRR no murmur, no edema, +2/4 P posterior tibialis pulses. no carotid bruits. No JVD. Chest: CTAB, no wheeze, rhonchi or crackles. normal Respiratory effort. good Air movement. Abd: Soft. flat. NTND. BS present. no Masses palpated. No hepatosplenomegaly. No rebound tenderness or guarding. Large diathesis recti present. Skin: no rashes, purpura or petechiae. Warm and well-perfused. Skin intact. Neuro/Msk:  Normal gait. PERLA. EOMi. Alert. Oriented x3.  Cranial nerves II through XII intact. Muscle strength 5/5 upper/lower extremity. DTRs equal bilaterally. Psych: Normal affect, dress and demeanor. Normal speech. Normal thought content and judgment.  No exam data present  Assessment/plan: AXEL MEAS is a 27 y.o. female present for CPE. Encounter for preventive health examination Patient was encouraged to exercise greater than 150 minutes a week. Patient was encouraged to choose a diet filled with fresh fruits and vegetables, and lean meats. AVS provided to patient today for education/recommendation on gender specific health and safety maintenance. Colonoscopy: No FHX, routine screen at 48 Mammogram: No fhx, routine screen at 31, Has OB/GYN Cervical cancer screening: UTD 2016, Follows with Ob/GYN, no abnl PAP Immunizations: Flu UTD through  [facility]. Tdap UTD 2015 Infectious disease screening: HIV screen completed, Negative Reviewed all labs with her today.  Diastasis recti:  - discussed options with her today. She is not certain if she is finished having children in order to consider having surgery.  IUD (intrauterine device) in place - 01/2017 placed.  Follow with GYN  Return in about 1 year (around 03/02/2019) for CPE.  Electronically signed by: [name], DO [place]

## 2018-03-01 NOTE — Patient Instructions (Addendum)
Zinc enriched diet.  Vit D supplement - 400-600u daily  Health Maintenance, Female Adopting a healthy lifestyle and getting preventive care can go a long way to promote health and wellness. Talk with your health care provider about what schedule of regular examinations is right for you. This is a good chance for you to check in with your provider about disease prevention and staying healthy. In between checkups, there are plenty of things you can do on your own. Experts have done a lot of research about which lifestyle changes and preventive measures are most likely to keep you healthy. Ask your health care provider for more information. Weight and diet Eat a healthy diet  Be sure to include plenty of vegetables, fruits, low-fat dairy products, and lean protein.  Do not eat a lot of foods high in solid fats, added sugars, or salt.  Get regular exercise. This is one of the most important things you can do for your health. ? Most adults should exercise for at least 150 minutes each week. The exercise should increase your heart rate and make you sweat (moderate-intensity exercise). ? Most adults should also do strengthening exercises at least twice a week. This is in addition to the moderate-intensity exercise.  Maintain a healthy weight  Body mass index (BMI) is a measurement that can be used to identify possible weight problems. It estimates body fat based on height and weight. Your health care provider can help determine your BMI and help you achieve or maintain a healthy weight.  For females 60 years of age and older: ? A BMI below 18.5 is considered underweight. ? A BMI of 18.5 to 24.9 is normal. ? A BMI of 25 to 29.9 is considered overweight. ? A BMI of 30 and above is considered obese.  Watch levels of cholesterol and blood lipids  You should start having your blood tested for lipids and cholesterol at 29 years of age, then have this test every 5 years.  You may need to have your  cholesterol levels checked more often if: ? Your lipid or cholesterol levels are high. ? You are older than 29 years of age. ? You are at high risk for heart disease.  Cancer screening Lung Cancer  Lung cancer screening is recommended for adults 23-45 years old who are at high risk for lung cancer because of a history of smoking.  A yearly low-dose CT scan of the lungs is recommended for people who: ? Currently smoke. ? Have quit within the past 15 years. ? Have at least a 30-pack-year history of smoking. A pack year is smoking an average of one pack of cigarettes a day for 1 year.  Yearly screening should continue until it has been 15 years since you quit.  Yearly screening should stop if you develop a health problem that would prevent you from having lung cancer treatment.  Breast Cancer  Practice breast self-awareness. This means understanding how your breasts normally appear and feel.  It also means doing regular breast self-exams. Let your health care provider know about any changes, no matter how small.  If you are in your 20s or 30s, you should have a clinical breast exam (CBE) by a health care provider every 1-3 years as part of a regular health exam.  If you are 29 or older, have a CBE every year. Also consider having a breast X-ray (mammogram) every year.  If you have a family history of breast cancer, talk to your health care  provider about genetic screening.  If you are at high risk for breast cancer, talk to your health care provider about having an MRI and a mammogram every year.  Breast cancer gene (BRCA) assessment is recommended for women who have family members with BRCA-related cancers. BRCA-related cancers include: ? Breast. ? Ovarian. ? Tubal. ? Peritoneal cancers.  Results of the assessment will determine the need for genetic counseling and BRCA1 and BRCA2 testing.  Cervical Cancer Your health care provider may recommend that you be screened regularly  for cancer of the pelvic organs (ovaries, uterus, and vagina). This screening involves a pelvic examination, including checking for microscopic changes to the surface of your cervix (Pap test). You may be encouraged to have this screening done every 3 years, beginning at age 21.  For women ages 30-65, health care providers may recommend pelvic exams and Pap testing every 3 years, or they may recommend the Pap and pelvic exam, combined with testing for human papilloma virus (HPV), every 5 years. Some types of HPV increase your risk of cervical cancer. Testing for HPV may also be done on women of any age with unclear Pap test results.  Other health care providers may not recommend any screening for nonpregnant women who are considered low risk for pelvic cancer and who do not have symptoms. Ask your health care provider if a screening pelvic exam is right for you.  If you have had past treatment for cervical cancer or a condition that could lead to cancer, you need Pap tests and screening for cancer for at least 20 years after your treatment. If Pap tests have been discontinued, your risk factors (such as having a new sexual partner) need to be reassessed to determine if screening should resume. Some women have medical problems that increase the chance of getting cervical cancer. In these cases, your health care provider may recommend more frequent screening and Pap tests.  Colorectal Cancer  This type of cancer can be detected and often prevented.  Routine colorectal cancer screening usually begins at 29 years of age and continues through 29 years of age.  Your health care provider may recommend screening at an earlier age if you have risk factors for colon cancer.  Your health care provider may also recommend using home test kits to check for hidden blood in the stool.  A small camera at the end of a tube can be used to examine your colon directly (sigmoidoscopy or colonoscopy). This is done to  check for the earliest forms of colorectal cancer.  Routine screening usually begins at age 50.  Direct examination of the colon should be repeated every 5-10 years through 29 years of age. However, you may need to be screened more often if early forms of precancerous polyps or small growths are found.  Skin Cancer  Check your skin from head to toe regularly.  Tell your health care provider about any new moles or changes in moles, especially if there is a change in a mole's shape or color.  Also tell your health care provider if you have a mole that is larger than the size of a pencil eraser.  Always use sunscreen. Apply sunscreen liberally and repeatedly throughout the day.  Protect yourself by wearing long sleeves, pants, a wide-brimmed hat, and sunglasses whenever you are outside.  Heart disease, diabetes, and high blood pressure  High blood pressure causes heart disease and increases the risk of stroke. High blood pressure is more likely to develop in: ?   People who have blood pressure in the high end of the normal range (130-139/85-89 mm Hg). ? People who are overweight or obese. ? People who are African American.  If you are 18-39 years of age, have your blood pressure checked every 3-5 years. If you are 40 years of age or older, have your blood pressure checked every year. You should have your blood pressure measured twice-once when you are at a hospital or clinic, and once when you are not at a hospital or clinic. Record the average of the two measurements. To check your blood pressure when you are not at a hospital or clinic, you can use: ? An automated blood pressure machine at a pharmacy. ? A home blood pressure monitor.  If you are between 55 years and 79 years old, ask your health care provider if you should take aspirin to prevent strokes.  Have regular diabetes screenings. This involves taking a blood sample to check your fasting blood sugar level. ? If you are at a  normal weight and have a low risk for diabetes, have this test once every three years after 29 years of age. ? If you are overweight and have a high risk for diabetes, consider being tested at a younger age or more often. Preventing infection Hepatitis B  If you have a higher risk for hepatitis B, you should be screened for this virus. You are considered at high risk for hepatitis B if: ? You were born in a country where hepatitis B is common. Ask your health care provider which countries are considered high risk. ? Your parents were born in a high-risk country, and you have not been immunized against hepatitis B (hepatitis B vaccine). ? You have HIV or AIDS. ? You use needles to inject street drugs. ? You live with someone who has hepatitis B. ? You have had sex with someone who has hepatitis B. ? You get hemodialysis treatment. ? You take certain medicines for conditions, including cancer, organ transplantation, and autoimmune conditions.  Hepatitis C  Blood testing is recommended for: ? Everyone born from 1945 through 1965. ? Anyone with known risk factors for hepatitis C.  Sexually transmitted infections (STIs)  You should be screened for sexually transmitted infections (STIs) including gonorrhea and chlamydia if: ? You are sexually active and are younger than 29 years of age. ? You are older than 29 years of age and your health care provider tells you that you are at risk for this type of infection. ? Your sexual activity has changed since you were last screened and you are at an increased risk for chlamydia or gonorrhea. Ask your health care provider if you are at risk.  If you do not have HIV, but are at risk, it may be recommended that you take a prescription medicine daily to prevent HIV infection. This is called pre-exposure prophylaxis (PrEP). You are considered at risk if: ? You are sexually active and do not regularly use condoms or know the HIV status of your  partner(s). ? You take drugs by injection. ? You are sexually active with a partner who has HIV.  Talk with your health care provider about whether you are at high risk of being infected with HIV. If you choose to begin PrEP, you should first be tested for HIV. You should then be tested every 3 months for as long as you are taking PrEP. Pregnancy  If you are premenopausal and you may become pregnant, ask your health   care provider about preconception counseling.  If you may become pregnant, take 400 to 800 micrograms (mcg) of folic acid every day.  If you want to prevent pregnancy, talk to your health care provider about birth control (contraception). Osteoporosis and menopause  Osteoporosis is a disease in which the bones lose minerals and strength with aging. This can result in serious bone fractures. Your risk for osteoporosis can be identified using a bone density scan.  If you are 16 years of age or older, or if you are at risk for osteoporosis and fractures, ask your health care provider if you should be screened.  Ask your health care provider whether you should take a calcium or vitamin D supplement to lower your risk for osteoporosis.  Menopause may have certain physical symptoms and risks.  Hormone replacement therapy may reduce some of these symptoms and risks. Talk to your health care provider about whether hormone replacement therapy is right for you. Follow these instructions at home:  Schedule regular health, dental, and eye exams.  Stay current with your immunizations.  Do not use any tobacco products including cigarettes, chewing tobacco, or electronic cigarettes.  If you are pregnant, do not drink alcohol.  If you are breastfeeding, limit how much and how often you drink alcohol.  Limit alcohol intake to no more than 1 drink per day for nonpregnant women. One drink equals 12 ounces of beer, 5 ounces of wine, or 1 ounces of hard liquor.  Do not use street  drugs.  Do not share needles.  Ask your health care provider for help if you need support or information about quitting drugs.  Tell your health care provider if you often feel depressed.  Tell your health care provider if you have ever been abused or do not feel safe at home. This information is not intended to replace advice given to you by your health care provider. Make sure you discuss any questions you have with your health care provider. Document Released: 03/22/2011 Document Revised: 02/12/2016 Document Reviewed: 06/10/2015 Elsevier Interactive Patient Education  2018 [employer] [place] Diastasis recti is when the muscles of the abdomen (rectus abdominis muscles) become thin and separate. The result is a wider space between the right and left abdomen (abdominal) muscles. This wider space between the muscles may cause a bulge in the middle of your abdomen. You may notice this bulge when you are straining or when you sit up from a lying down position. Diastasis recti can affect men and women. It is most common among pregnant women, infants, people who are obese, and people who have had abdominal surgery. Exercise or surgical treatment may help correct it. What are the causes? Common causes of this condition include:  Pregnancy. The growing uterus puts pressure on the abdominal muscles, which causes the muscles to separate.  Obesity. Excess fat puts pressure on abdominal muscles.  Weightlifting.  Some abdomen exercises.  Advanced age.  Genetics.  Prior abdominal surgery.  What increases the risk? This condition is more likely to develop in:  Women.  Newborns, especially newborns who are born early (prematurely).  What are the signs or symptoms? Common symptoms of this condition include:  A bulge in the middle of the abdomen. You will notice it most when you sit up or strain.  Pain in the low back, pelvis, or hips.  Constipation.  Inability to  control when you urinate (urinary incontinence).  Bloating.  Poor posture.  How is this diagnosed? This  condition is diagnosed with a physical exam. Your health care provider will ask you to lie flat on your back and do a crunch or half sit-up. If you have diastasis recti, a vertical bulge will appear between your abdominal muscles in the center of your abdomen. Your health care provider will measure the gap between your muscles with one of the following:  A medical device used to measure the space between two objects (caliper).  A tape measure.  CT scan.  Ultrasound.  Finger spaces. Your health care provider will measure the space using their fingers.  How is this treated? If your muscle separation is not too large, you may not need treatment. However, if you are a woman who plans to become pregnant again, you should treat this condition before your next pregnancy. Treatment may include:  Physical therapy to strengthen and tighten your abdominal muscles.  Lifestyle changes such as weight loss and exercise.  Over-the-counter pain medicines as needed.  Surgery to correct the separation.  Follow these instructions at home: Activity  Return to your normal activities as told by your health care provider. Ask your health care provider what activities are safe for you.  When lifting weights or doing exercises using your abdominal muscles or the muscles in the center of your body that give stability (core muscles), make sure you are doing your exercises and movements correctly. Proper form can help to prevent the condition from happening again. General instructions  If you are overweight, ask your health care provider for help with weight loss. Losing even a small amount of weight can help to improve your diastasis recti.  Take over-the-counter or prescription medicines only as told by your health care provider.  Do not strain. Straining can make the separation worse. Examples of  straining include: ? Pushing hard to have a bowel movement, such as due to constipation. ? Lifting heavy objects, including children. ? Standing up and sitting down.  Take steps to prevent constipation: ? Drink enough fluid to keep your urine clear or pale yellow. ? Take over-the-counter or prescription medicines only as directed. ? Eat foods that are high in fiber, such as fresh fruits and vegetables, whole grains, and beans. ? Limit foods that are high in fat and processed sugars, such as fried and sweet foods. Contact a health care provider if:  You notice a new bulge in your abdomen. Get help right away if:  You experience severe discomfort in your abdomen.  You develop severe abdominal pain along with nausea, vomiting, or fever. Summary  Diastasis recti is when the abdomen (abdominal) muscles become thin and separate. Your abdomen will stick out because the space between your right and left abdomen muscles has widened.  The most common symptom is a bulge in your abdomen. You will notice it most when you sit up or are straining.  This condition is diagnosed during a physical exam.  If the abdomen separation is not too big, you may choose not to have treatment. Otherwise, you may need to undergo physical therapy or surgery. This information is not intended to replace advice given to you by your health care provider. Make sure you discuss any questions you have with your health care provider. Document Released: 11/01/2016 Document Revised: 11/01/2016 Document Reviewed: 11/01/2016 Elsevier Interactive Patient Education  [employer].

## 2018-05-04 ENCOUNTER — Encounter: Payer: Self-pay | Admitting: Obstetrics and Gynecology

## 2018-05-04 ENCOUNTER — Ambulatory Visit (INDEPENDENT_AMBULATORY_CARE_PROVIDER_SITE_OTHER): Admitting: Obstetrics and Gynecology

## 2018-05-04 ENCOUNTER — Other Ambulatory Visit: Payer: Self-pay

## 2018-05-04 ENCOUNTER — Ambulatory Visit (INDEPENDENT_AMBULATORY_CARE_PROVIDER_SITE_OTHER)

## 2018-05-04 VITALS — BP 104/68 | HR 84

## 2018-05-04 DIAGNOSIS — T8332XA Displacement of intrauterine contraceptive device, initial encounter: Secondary | ICD-10-CM

## 2018-05-04 DIAGNOSIS — Z30431 Encounter for routine checking of intrauterine contraceptive device: Secondary | ICD-10-CM | POA: Diagnosis not present

## 2018-05-04 NOTE — Progress Notes (Signed)
GYNECOLOGY  VISIT   HPI: 27 y.o.   Married  Caucasian  female   G2P2002 with No LMP recorded. (Menstrual status: IUD).   here for    Cycles started back in April when she stopped nursing.  Cycles every 21-25 days x 7-10 days, light bleeding to spotting. No significant cramps.  GYNECOLOGIC HISTORY: No LMP recorded. (Menstrual status: IUD). Contraception:IUD  OB History    Gravida  2   Para  2   Term  2   Preterm      AB      Living  2     SAB      TAB      Ectopic      Multiple  0   Live Births  2              Patient Active Problem List   Diagnosis Date Noted  . IUD (intrauterine device) in place 03/01/2018  . Diastasis recti 03/01/2018  . Worries 02/16/2018  . Back skin lesion 09/05/2015  . Anemia 08/25/2015  . Encounter for preventive health examination 08/25/2015    Past Medical History:  Diagnosis Date  . Anemia    after delivery  . Cholestasis during pregnancy   . Medical history non-contributory   . Normal pregnancy 04/18/2014  . SVD (spontaneous vaginal delivery) 04/19/2014  . SVD (spontaneous vaginal delivery) 12/28/2016    Past Surgical History:  Procedure Laterality Date  . BREAST SURGERY Left    cyst aspiration  . WISDOM TOOTH EXTRACTION      Current Outpatient Medications  Medication Sig Dispense Refill  . fluticasone (FLONASE) 50 MCG/ACT nasal spray Place 2 sprays into both nostrils daily for 10 days. 16 g 0  . ibuprofen (ADVIL,MOTRIN) 600 MG tablet Take 1 tablet (600 mg total) by mouth every 6 (six) hours. 30 tablet 0  . levonorgestrel (MIRENA, 52 MG,) 20 MCG/24HR IUD Mirena 20 mcg/24 hr (5 years) intrauterine device    . Prenatal Vit-Fe Fumarate-FA (PRENATAL MULTIVITAMIN) TABS tablet Take 1 tablet by mouth daily at 12 noon. 30 tablet 12   No current facility-administered medications for this visit.      ALLERGIES: Patient has no known allergies.  Family History  Problem Relation Age of Onset  . Heart disease Father   .  Hashimoto's thyroiditis Sister   . Heart disease Maternal Grandfather     Social History   Socioeconomic History  . Marital status: Married    Spouse name: Not on file  . Number of children: Not on file  . Years of education: Not on file  . Highest education level: Not on file  Occupational History  . Not on file  Social Needs  . Financial resource strain: Not on file  . Food insecurity:    Worry: Not on file    Inability: Not on file  . Transportation needs:    Medical: Not on file    Non-medical: Not on file  Tobacco Use  . Smoking status: Never Smoker  . Smokeless tobacco: Never Used  Substance and Sexual Activity  . Alcohol use: No  . Drug use: No  . Sexual activity: Yes    Birth control/protection: None  Lifestyle  . Physical activity:    Days per week: Not on file    Minutes per session: Not on file  . Stress: Not on file  Relationships  . Social connections:    Talks on phone: Not on file    Gets together:  Not on file    Attends religious service: Not on file    Active member of club or organization: Not on file    Attends meetings of clubs or organizations: Not on file    Relationship status: Not on file  . Intimate partner violence:    Fear of current or ex partner: Not on file    Emotionally abused: Not on file    Physically abused: Not on file    Forced sexual activity: Not on file  Other Topics Concern  . Not on file  Social History Narrative   Married. Husband's names Feliz Beamravis. One child (female) Vernard GamblesZoe Jane.   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    Drinks caffeinated beverages. Takes a daily vitamin.   Wears her seatbelt. Smoke detector located in the home. Firearms located in the home and a locked.   Feels safe in her relationships.     ROS  Review of Systems  Constitutional: Negative.   HENT: Negative.   Eyes: Negative.   Respiratory: Negative.   Cardiovascular: Negative.   Gastrointestinal: Negative.   Genitourinary:       Extended  menses with IUD  Musculoskeletal: Negative.   Skin: Negative.   Neurological: Negative.   Endo/Heme/Allergies: Negative.   Psychiatric/Behavioral: Negative.    PHYSICAL EXAMINATION:    BP 104/68   Pulse 84     General appearance: alert, cooperative and appears stated age  Ultrasound images reviewed with the patient. IUD in place.  ASSESSMENT IUD strings missing. IUD in place on ultrasound     PLAN Patient reassured F/U as needed    An After Visit Summary was printed and given to the patient.

## 2018-05-04 NOTE — Progress Notes (Signed)
  Review of Systems  Constitutional: Negative.   HENT: Negative.   Eyes: Negative.   Respiratory: Negative.   Cardiovascular: Negative.   Gastrointestinal: Negative.   Genitourinary:       Extended menses with IUD  Musculoskeletal: Negative.   Skin: Negative.   Neurological: Negative.   Endo/Heme/Allergies: Negative.   Psychiatric/Behavioral: Negative.

## 2018-06-05 DIAGNOSIS — N9089 Other specified noninflammatory disorders of vulva and perineum: Secondary | ICD-10-CM | POA: Insufficient documentation

## 2018-06-05 HISTORY — DX: Other specified noninflammatory disorders of vulva and perineum: N90.89

## 2018-06-20 IMAGING — US US OB COMP LESS 14 WK
1 series · 13 of 28 positions shown · non-contrast
Comparison: None.

CLINICAL DATA: Right adnexal pain x1 day



[Series 1: us ob comp less 14 wk · 0.22mm/px · 13 of 62 slices shown]
[im 3/62]
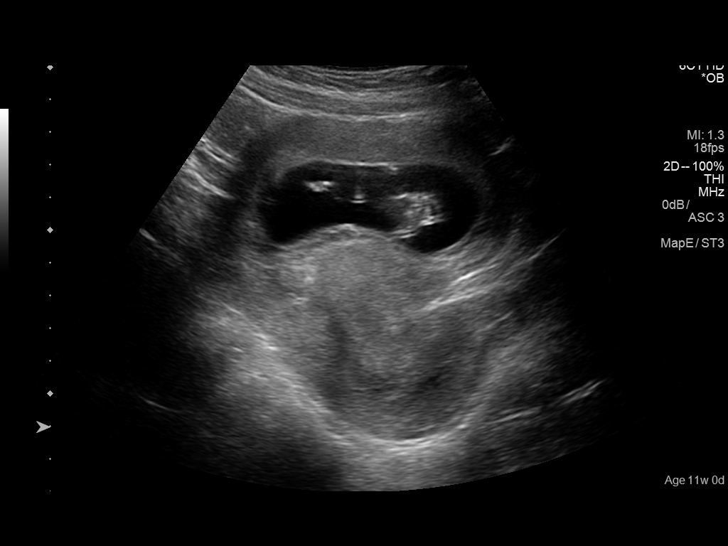
[im 7/62]
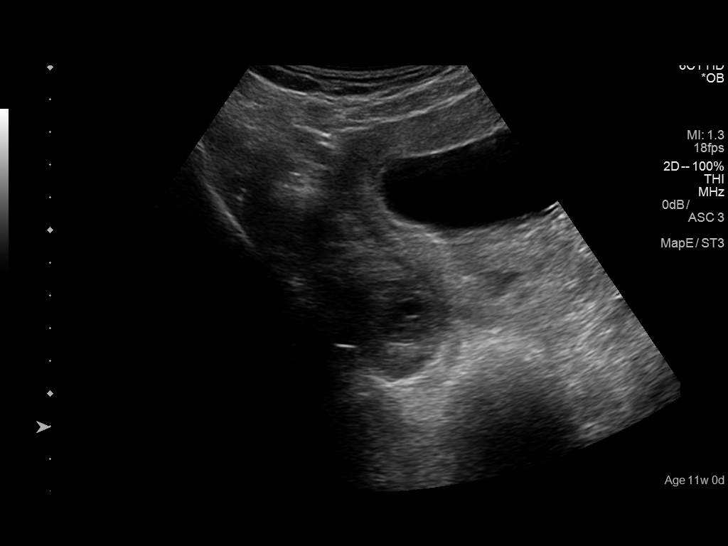
[im 12/62]
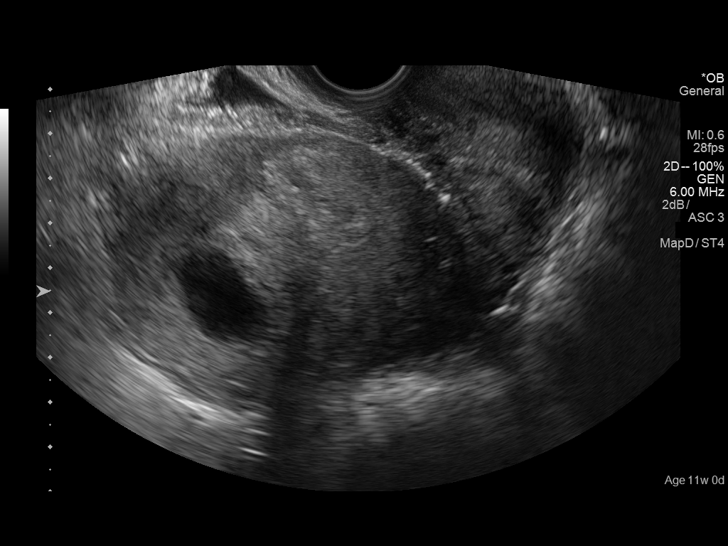
[im 16/62]
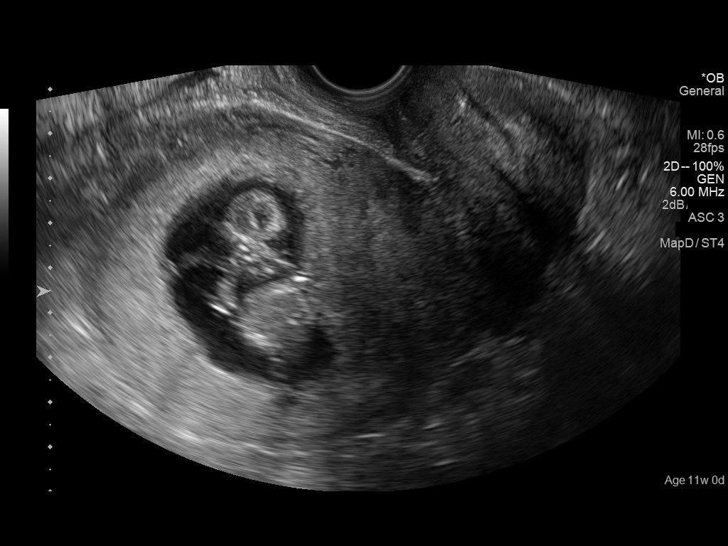
[im 21/62]
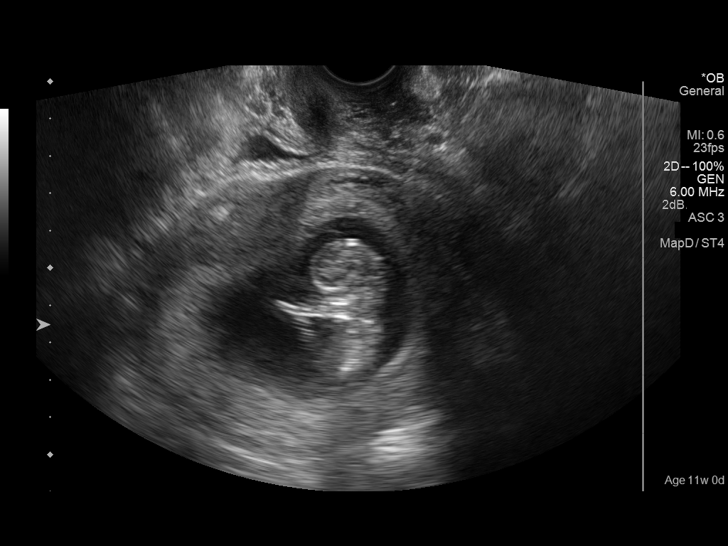
[im 25/62]
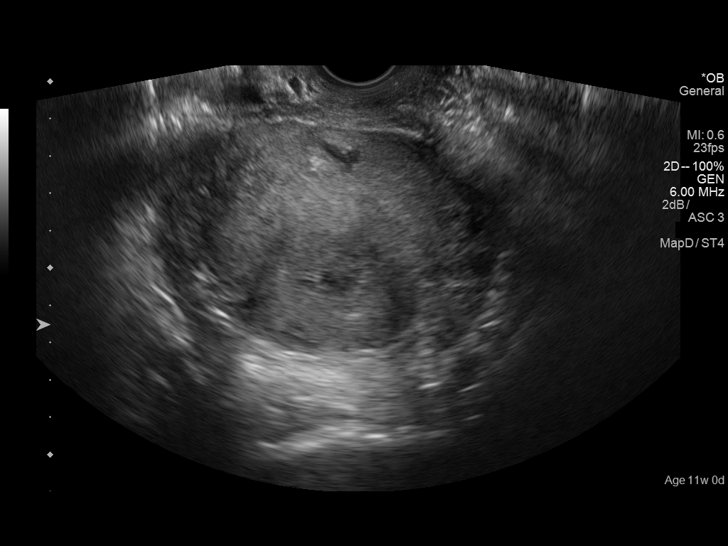
[im 32/62]
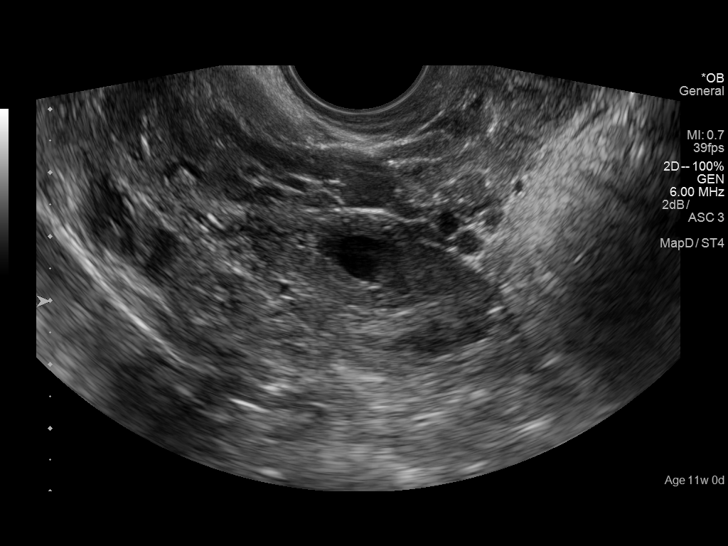
[im 37/62]
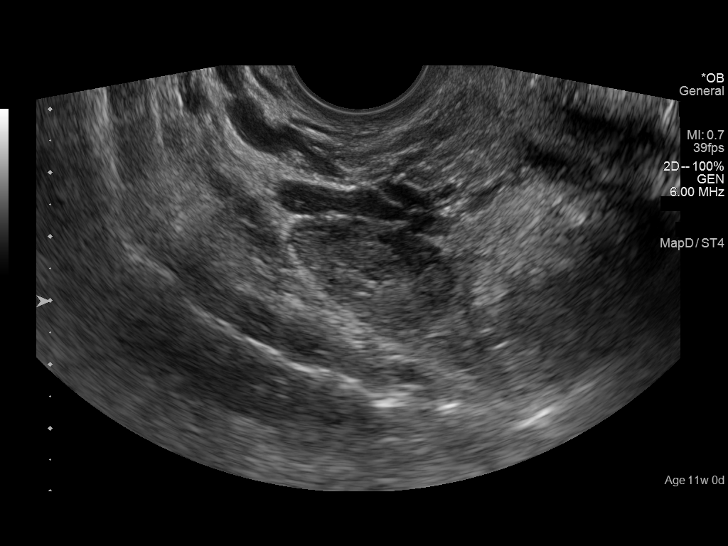
[im 41/62]
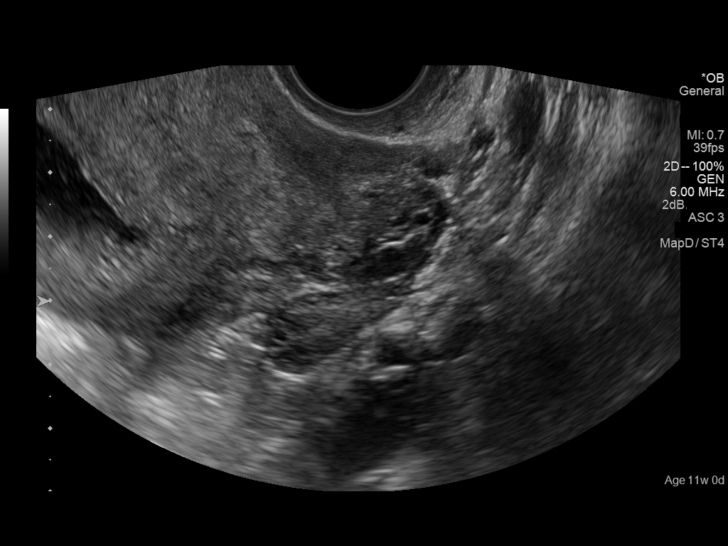
[im 46/62]
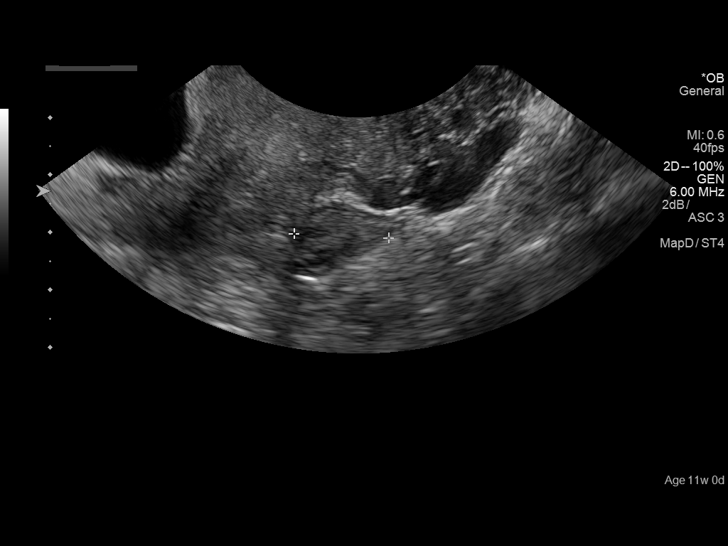
[im 50/62]
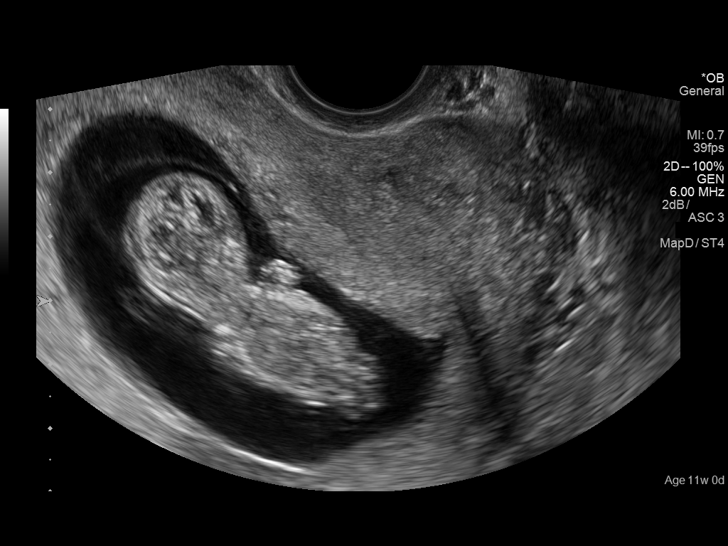
[im 55/62]
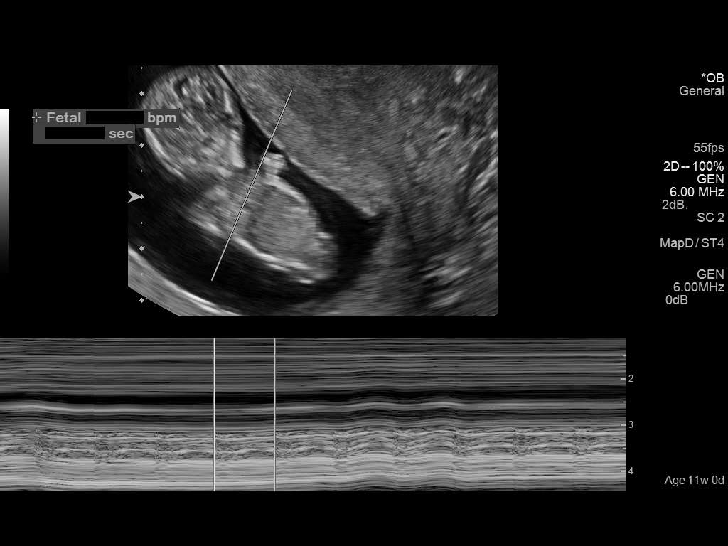
[im 59/62]
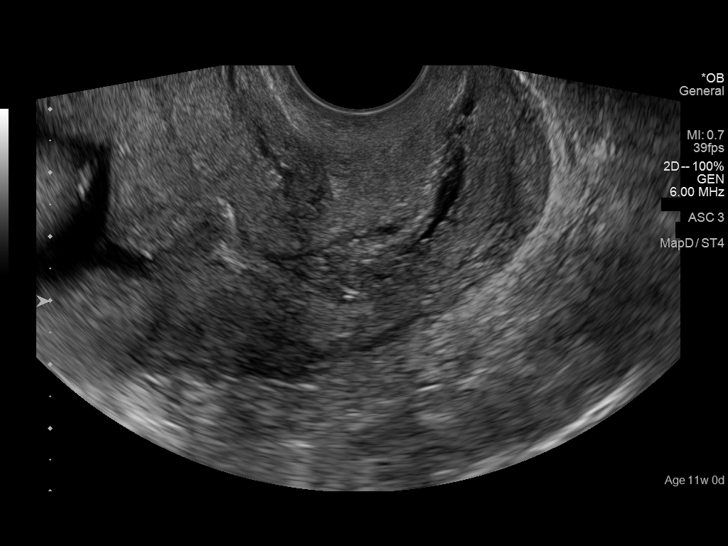

[13 of 28 positions shown; findings below may reference images not displayed]

FINDINGS: Intrauterine gestational sac: Single intrauterine gestational sac
present.

Yolk sac:  Visualized

Embryo:  Single intrauterine gestation present.

Cardiac Activity: Present

Heart Rate: 168 bpm

CRL:   5.09  cm   11 w 6 d                  US EDC: 01/12/2017

Subchorionic hemorrhage:  None visualized.

Maternal uterus/adnexae: Minimal amount of fluid in the cervical
canal. Uterus measures 11 cm transverse by 8.3 cm AP x 11.8 cm
longitudinal. The right ovary measures 3.7 x 2 by 3.3 cm and
contains a normal-appearing follicles. The left ovary measures 3 x
1.1 by 1.7 cm. No significant pelvic fluid.

Pulsed Doppler evaluation of both ovaries demonstrates normal
appearing low-resistance arterial and venous waveforms.
IMPRESSION: 1. Single intrauterine gestation with estimated gestational age and
dates as above.
2. Trace amount of fluid in the cervical canal.
3. Normal ultrasound appearance of the ovaries with no sonographic
evidence for torsion.

## 2018-07-05 ENCOUNTER — Other Ambulatory Visit: Payer: Self-pay

## 2018-07-05 ENCOUNTER — Emergency Department (HOSPITAL_COMMUNITY)
Admission: EM | Admit: 2018-07-05 | Discharge: 2018-07-05 | Disposition: A | Discharge status: Home / Self Care | Source: Home / Self Care | Attending: Emergency Medicine | Admitting: Emergency Medicine

## 2018-07-05 ENCOUNTER — Emergency Department (HOSPITAL_BASED_OUTPATIENT_CLINIC_OR_DEPARTMENT_OTHER)
Admission: EM | Admit: 2018-07-05 | Discharge: 2018-07-05 | Discharge status: Against Medical Advice | Attending: Emergency Medicine | Admitting: Emergency Medicine

## 2018-07-05 ENCOUNTER — Encounter (HOSPITAL_COMMUNITY): Payer: Self-pay | Admitting: *Deleted

## 2018-07-05 ENCOUNTER — Encounter (HOSPITAL_BASED_OUTPATIENT_CLINIC_OR_DEPARTMENT_OTHER): Payer: Self-pay | Admitting: *Deleted

## 2018-07-05 ENCOUNTER — Emergency Department (HOSPITAL_COMMUNITY)

## 2018-07-05 DIAGNOSIS — N83202 Unspecified ovarian cyst, left side: Secondary | ICD-10-CM

## 2018-07-05 DIAGNOSIS — R109 Unspecified abdominal pain: Secondary | ICD-10-CM | POA: Diagnosis not present

## 2018-07-05 DIAGNOSIS — Z5321 Procedure and treatment not carried out due to patient leaving prior to being seen by health care provider: Secondary | ICD-10-CM | POA: Insufficient documentation

## 2018-07-05 DIAGNOSIS — R1030 Lower abdominal pain, unspecified: Secondary | ICD-10-CM

## 2018-07-05 LAB — COMPREHENSIVE METABOLIC PANEL
ALK PHOS: 17 U/L — AB (ref 38–126)
ALT: 11 U/L (ref 0–44)
AST: 18 U/L (ref 15–41)
Albumin: 4.3 g/dL (ref 3.5–5.0)
Anion gap: 6 (ref 5–15)
BILIRUBIN TOTAL: 0.7 mg/dL (ref 0.3–1.2)
BUN: 11 mg/dL (ref 6–20)
CALCIUM: 8.9 mg/dL (ref 8.9–10.3)
CO2: 24 mmol/L (ref 22–32)
Chloride: 108 mmol/L (ref 98–111)
Creatinine, Ser: 0.82 mg/dL (ref 0.44–1.00)
Glucose, Bld: 101 mg/dL — ABNORMAL HIGH (ref 70–99)
Potassium: 3.6 mmol/L (ref 3.5–5.1)
Sodium: 138 mmol/L (ref 135–145)
TOTAL PROTEIN: 7.6 g/dL (ref 6.5–8.1)

## 2018-07-05 LAB — CBC
HCT: 40.5 % (ref 36.0–46.0)
Hemoglobin: 13.4 g/dL (ref 12.0–15.0)
MCH: 30.6 pg (ref 26.0–34.0)
MCHC: 33.1 g/dL (ref 30.0–36.0)
MCV: 92.5 fL (ref 80.0–100.0)
NRBC: 0 % (ref 0.0–0.2)
PLATELETS: 308 10*3/uL (ref 150–400)
RBC: 4.38 MIL/uL (ref 3.87–5.11)
RDW: 11.8 % (ref 11.5–15.5)
WBC: 10.3 10*3/uL (ref 4.0–10.5)

## 2018-07-05 LAB — URINALYSIS, ROUTINE W REFLEX MICROSCOPIC
Bilirubin Urine: NEGATIVE
Glucose, UA: NEGATIVE mg/dL
Ketones, ur: 5 mg/dL — AB
Leukocytes, UA: NEGATIVE
Nitrite: NEGATIVE
Protein, ur: NEGATIVE mg/dL
SPECIFIC GRAVITY, URINE: 1.005 (ref 1.005–1.030)
pH: 6 (ref 5.0–8.0)

## 2018-07-05 LAB — WET PREP, GENITAL
CLUE CELLS WET PREP: NONE SEEN
SPERM: NONE SEEN
Trich, Wet Prep: NONE SEEN
Yeast Wet Prep HPF POC: NONE SEEN

## 2018-07-05 LAB — I-STAT BETA HCG BLOOD, ED (MC, WL, AP ONLY): I-stat hCG, quantitative: <5 m[IU]/mL (ref <5)

## 2018-07-05 LAB — LIPASE, BLOOD: LIPASE: 31 U/L (ref 11–51)

## 2018-07-05 LAB — HEMOGLOBIN AND HEMATOCRIT, BLOOD
HCT: 38.2 % (ref 36.0–46.0)
Hemoglobin: 12.3 g/dL (ref 12.0–15.0)

## 2018-07-05 MED ORDER — ONDANSETRON HCL 4 MG/2ML IJ SOLN: 4.0000 mg | Freq: Once | INTRAMUSCULAR | Status: DC

## 2018-07-05 MED ORDER — IOHEXOL 300 MG/ML  SOLN
100.0000 mL | Freq: Once | INTRAMUSCULAR | Status: AC | PRN
  Administered 2018-07-05: 100 mL via INTRAVENOUS

## 2018-07-05 MED ORDER — SODIUM CHLORIDE 0.9 % IV BOLUS
1000.0000 mL | Freq: Once | INTRAVENOUS | Status: AC
  Administered 2018-07-05: 1000 mL via INTRAVENOUS

## 2018-07-05 NOTE — Discharge Instructions (Addendum)
There was evidence of cysts on the left ovary, as detailed below. Antiinflammatory medications: Take 600 mg of ibuprofen every 6 hours or 440 mg (over the counter dose) to 500 mg (prescription dose) of naproxen every 12 hours for the next 3 days. After this time, these medications may be used as needed for pain. Take these medications with food to avoid upset stomach. Choose only one of these medications, do not take them together. Acetaminophen (generic for Tylenol): Should you continue to have additional pain while taking the ibuprofen or naproxen, you may add in acetaminophen as needed. Your daily total maximum amount of acetaminophen from all sources should be limited to 4000mg /day for persons without liver problems, or 2000mg /day for those with liver problems.  Cysts in the left ovary. The larger of these contains some internal  echoes and has a thickened wall. This may reflect a hemorrhagic  cyst. Follow-up ultrasound in 8-12 weeks is recommended.

## 2018-07-05 NOTE — ED Notes (Signed)
Pt told registration staff that she is going to [facility] ER because she has a friend there and the wait is shorter.

## 2018-07-05 NOTE — ED Triage Notes (Signed)
Pt in c/o lower abdominal cramping, states it started this morning and she felt cramping light she needed to have a bowel movement, did have small bowel movement that was diarrhea but pain became more intense afterward, pain is intermittent, denies pain with urination, pain is worse with movement or coughing

## 2018-07-05 NOTE — ED Triage Notes (Signed)
Pt states she had a small bm this am, then had lower abd pain, went to urgent care, "they said my urine was fine, but told me to come here to have you look at it."

## 2018-07-05 NOTE — ED Provider Notes (Signed)
[facility] EMERGENCY DEPARTMENT Provider Note   CSN: [id number] Arrival date & time: 07/05/18  1023     History   Chief Complaint Chief Complaint  Patient presents with  . Abdominal Pain    HPI ALDEN FEAGAN is a 27 y.o. female.  HPI   SELAM PIETSCH is a 27 y.o. female, with a history of anemia, presenting to the ED with lower abdominal pain beginning this morning.  States she awoke with mild cramping across the lower abdomen that made her feel as though she had to have a bowel movement.  She had a small amount of diarrhea.  Shortly thereafter, she had severe pain, sharp and stabbing, equally across the lower abdomen.  Pain has somewhat improved, now rated 3/10, but is worsened with palpation, coughing, or movement.  Accompanied by nausea. She does not have traditional menstrual cycles due to her IUD placement, but but has monthly spotting and this last occurred at the end of September.  Denies fever/chills, vomiting, hematochezia/melena, abnormal vaginal discharge or bleeding, trauma, syncope, urinary symptoms, back or flank pain, or any other complaints.    Past Medical History:  Diagnosis Date  . Anemia    after delivery  . Cholestasis during pregnancy   . Medical history non-contributory   . Normal pregnancy 04/18/2014  . SVD (spontaneous vaginal delivery) 04/19/2014  . SVD (spontaneous vaginal delivery) 12/28/2016    Patient Active Problem List   Diagnosis Date Noted  . IUD (intrauterine device) in place 03/01/2018  . Diastasis recti 03/01/2018  . Worries 02/16/2018  . Back skin lesion 09/05/2015  . Anemia 08/25/2015  . Encounter for preventive health examination 08/25/2015    Past Surgical History:  Procedure Laterality Date  . BREAST SURGERY Left    cyst aspiration  . WISDOM TOOTH EXTRACTION       OB History    Gravida  2   Para  2   Term  2   Preterm      AB      Living  2     SAB      TAB      Ectopic      Multiple  0   Live Births  2            Home Medications    Prior to Admission medications   Medication Sig Start Date End Date Taking? Authorizing Provider  fluticasone (FLONASE) 50 MCG/ACT nasal spray Place 2 sprays into both nostrils daily for 10 days. 11/18/17 11/28/17  Benay Pike, NP  ibuprofen (ADVIL,MOTRIN) 600 MG tablet Take 1 tablet (600 mg total) by mouth every 6 (six) hours. 12/30/16   Huel Cote, MD  levonorgestrel (MIRENA, 52 MG,) 20 MCG/24HR IUD 1 each by Intrauterine route once.     [provider]  Prenatal Vit-Fe Fumarate-FA (PRENATAL MULTIVITAMIN) TABS tablet Take 1 tablet by mouth daily at 12 noon. 04/21/14   Bovard-StuckertAugusto Gamble, MD    Family History Family History  Problem Relation Age of Onset  . Heart disease Father   . Hashimoto's thyroiditis Sister   . Heart disease Maternal Grandfather     Social History Social History   Tobacco Use  . Smoking status: Never Smoker  . Smokeless tobacco: Never Used  Substance Use Topics  . Alcohol use: No  . Drug use: No     Allergies   Patient has no known allergies.   Review of Systems Review of Systems  Constitutional: Negative  for chills, diaphoresis and fever.  Respiratory: Negative for shortness of breath.   Cardiovascular: Negative for chest pain.  Gastrointestinal: Positive for abdominal pain, diarrhea and nausea. Negative for blood in stool and vomiting.  Genitourinary: Negative for dysuria, flank pain, hematuria, vaginal bleeding and vaginal discharge.  Musculoskeletal: Negative for back pain.  Neurological: Negative for syncope.  All other systems reviewed and are negative.    Physical Exam Updated Vital Signs BP 121/81 (BP Location: Right Arm)   Pulse (!) 102   Temp 98.2 F (36.8 C) (Oral)   Resp 20   SpO2 100%   Physical Exam  Constitutional: She appears well-developed and well-nourished. No distress.  HENT:  Head: Normocephalic and atraumatic.  Eyes:  Conjunctivae are normal.  Neck: Neck supple.  Cardiovascular: Normal rate, regular rhythm, normal heart sounds and intact distal pulses.  Pulmonary/Chest: Effort normal and breath sounds normal. No respiratory distress.  Abdominal: Soft. There is tenderness. There is no guarding.    Patient is quite tender in the suprapubic region.  When either of her lower quadrants are palpated, pain radiates towards, and is produced in, the suprapubic region.  Genitourinary:  Genitourinary Comments: External genitalia normal Vagina with discharge - small amount of yellow-white discharge. Cervix  abnormal  - erosion around the cervical os.  Nonfriable. negative for cervical motion tenderness Adnexa palpated, no masses, positive for tenderness noted on the left. Bladder palpated positive for tenderness Uterus palpated no masses, positive for tenderness  No inguinal lymphadenopathy. Otherwise normal female genitalia. RN, Lowella Bandy, served as Biomedical engineer during exam.  Musculoskeletal: She exhibits no edema.  Lymphadenopathy:    She has no cervical adenopathy.  Neurological: She is alert.  Skin: Skin is warm and dry. She is not diaphoretic.  Psychiatric: She has a normal mood and affect. Her behavior is normal.  Nursing note and vitals reviewed.    ED Treatments / Results  Labs (all labs ordered are listed, but only abnormal results are displayed) Labs Reviewed  WET PREP, GENITAL - Abnormal; Notable for the following components:      Result Value   WBC, Wet Prep HPF POC MANY (*)    All other components within normal limits  COMPREHENSIVE METABOLIC PANEL - Abnormal; Notable for the following components:   Glucose, Bld 101 (*)    Alkaline Phosphatase 17 (*)    All other components within normal limits  URINALYSIS, ROUTINE W REFLEX MICROSCOPIC - Abnormal; Notable for the following components:   Color, Urine STRAW (*)    Hgb urine dipstick SMALL (*)    Ketones, ur 5 (*)    Bacteria, UA RARE (*)     All other components within normal limits  LIPASE, BLOOD  CBC  HEMOGLOBIN AND HEMATOCRIT, BLOOD  I-STAT BETA HCG BLOOD, ED ([facility], [facility], [facility] ONLY)  GC/CHLAMYDIA PROBE AMP () NOT AT [facility]    EKG None  Radiology US Transvaginal Non-ob  Result Date: 07/05/2018 CLINICAL DATA:  Sudden onset of lower abdominal and pelvic pain for the past day EXAM: TRANSABDOMINAL AND TRANSVAGINAL ULTRASOUND OF PELVIS DOPPLER ULTRASOUND OF OVARIES TECHNIQUE: Both transabdominal and transvaginal ultrasound examinations of the pelvis were performed. Transabdominal technique was performed for global imaging of the pelvis including uterus, ovaries, adnexal regions, and pelvic cul-de-sac. It was necessary to proceed with endovaginal exam following the transabdominal exam to visualize the uterus, endometrium, ovaries, and adnexal structures. Color and duplex Doppler ultrasound was utilized to evaluate blood flow to the ovaries. COMPARISON:  Outside pelvic ultrasound  examination of May 04, 2018 FINDINGS: Uterus Measurements: 8.9 x 4.9 x 7.0 cm. No fibroids or other mass visualized. Endometrium Thickness: 8.7 mm.  No focal abnormality visualized. Right ovary Measurements: 3.0 x 1.2 x 2.1 cm. Normal appearance/no adnexal mass. Developing follicles are present. Left ovary Measurements: 5.6 x 2.3 x 4.4 cm. There are cystic regions demonstrated within the left ovary exhibiting a few internal echoes. One measures 1.7 x 1.1 x 1.7 cm in the second measures 3.2 x 2.1 x 0.5 cm. Pulsed Doppler evaluation of both ovaries demonstrates normal low-resistance arterial and venous waveforms. Other findings Small amount of free pelvic fluid. IMPRESSION: Normal appearance of the uterus. An echogenic focus present in the endometrial cavity is consistent with the IUD. Normal endometrial stripe thickness. Normal vascularity of both ovaries. Cysts in the left ovary. The larger of these contains some internal echoes and has a thickened wall. This  may reflect a hemorrhagic cyst. Follow-up ultrasound in 8-12 weeks is recommended. Electronically Signed   By: David  [place] M.D.   On: 07/05/2018 13:24   US Pelvis Complete  Result Date: 07/05/2018 CLINICAL DATA:  Sudden onset of lower abdominal and pelvic pain for the past day EXAM: TRANSABDOMINAL AND TRANSVAGINAL ULTRASOUND OF PELVIS DOPPLER ULTRASOUND OF OVARIES TECHNIQUE: Both transabdominal and transvaginal ultrasound examinations of the pelvis were performed. Transabdominal technique was performed for global imaging of the pelvis including uterus, ovaries, adnexal regions, and pelvic cul-de-sac. It was necessary to proceed with endovaginal exam following the transabdominal exam to visualize the uterus, endometrium, ovaries, and adnexal structures. Color and duplex Doppler ultrasound was utilized to evaluate blood flow to the ovaries. COMPARISON:  Outside pelvic ultrasound examination of May 04, 2018 FINDINGS: Uterus Measurements: 8.9 x 4.9 x 7.0 cm. No fibroids or other mass visualized. Endometrium Thickness: 8.7 mm.  No focal abnormality visualized. Right ovary Measurements: 3.0 x 1.2 x 2.1 cm. Normal appearance/no adnexal mass. Developing follicles are present. Left ovary Measurements: 5.6 x 2.3 x 4.4 cm. There are cystic regions demonstrated within the left ovary exhibiting a few internal echoes. One measures 1.7 x 1.1 x 1.7 cm in the second measures 3.2 x 2.1 x 0.5 cm. Pulsed Doppler evaluation of both ovaries demonstrates normal low-resistance arterial and venous waveforms. Other findings Small amount of free pelvic fluid. IMPRESSION: Normal appearance of the uterus. An echogenic focus present in the endometrial cavity is consistent with the IUD. Normal endometrial stripe thickness. Normal vascularity of both ovaries. Cysts in the left ovary. The larger of these contains some internal echoes and has a thickened wall. This may reflect a hemorrhagic cyst. Follow-up ultrasound in 8-12 weeks is  recommended. Electronically Signed   By: David  [place] M.D.   On: 07/05/2018 13:24   Ct Abdomen Pelvis W Contrast  Result Date: 07/05/2018 CLINICAL DATA:  27 year old female with sudden lower abdominal pain. Tachycardia. EXAM: CT ABDOMEN AND PELVIS WITH CONTRAST TECHNIQUE: Multidetector CT imaging of the abdomen and pelvis was performed using the standard protocol following bolus administration of intravenous contrast. CONTRAST:  OMNIPAQUE IOHEXOL 300 MG/ML  SOLN COMPARISON:  Pelvis ultrasound 1235 hours today and earlier. FINDINGS: Lower chest: Normal lung bases.  No pericardial or pleural effusion. Hepatobiliary: Negative liver and gallbladder. There is trace free fluid adjacent to the posterior inferior right liver tip. Pancreas: Negative. Spleen: There is trace perisplenic fluid (series 3, image 15), otherwise the spleen appears normal. Adrenals/Urinary Tract: Normal adrenal glands. 2 millimeter calculus at the left renal midpole. Occasional small benign  renal cysts suspected. Otherwise normal bilateral renal enhancement. No perinephric stranding. No hydronephrosis. Proximal ureters appear normal. Unremarkable urinary bladder. Stomach/Bowel: Retained stool in the distal sigmoid and rectum. Decompressed proximal sigmoid. There is a small volume of simple appearing free fluid in both lower quadrants greater on the right (coronal images 33 and 27). Superimposed small volume of fluid also in the left small bowel mesentery (also on image 27). The left colon is decompressed and within normal limits. Negative transverse colon and hepatic flexure. The right colon appears normal. A normal appendix is visible coursing to the midline from the cecum on coronal images 26 and 28. There is no dilated small bowel. No segmental small bowel inflammation is identified. Negative stomach and duodenum. No abdominal free air. There is midline rectus muscle diastasis at the umbilicus but no discrete hernia.  Vascular/Lymphatic: Major arterial structures and the portal venous system appear patent and normal. No lymphadenopathy. Reproductive: IUD, otherwise negative as described on the dedicated pelvis ultrasound with Doppler today. Other: No pelvic free fluid is evident. Musculoskeletal: Mildly hypoplastic right side L4 and L5 posterior elements. Small probably benign sclerotic bone island in the right sacral ala on series 3, image 57. No acute osseous abnormality identified. IMPRESSION: 1. Small volume of simple appearing free fluid in all four quadrants of the abdomen and in some of the small bowel mesentery. Fluid is most apparent in the lower quadrants. This is abnormal, but no origin of the fluid or inflammatory process is identified. The appendix is normal. No colonic diverticula or abnormal small bowel is identified. 2. Rectus muscle diastasis in the midline with subjacent small bowel loops but no discrete hernia. 3. Mild left nephrolithiasis. Electronically Signed   By: Odessa Fleming M.D.   On: 07/05/2018 15:37   [place] Art/ven Flow Abd Pelv Doppler  Result Date: 07/05/2018 CLINICAL DATA:  Sudden onset of lower abdominal and pelvic pain for the past day EXAM: TRANSABDOMINAL AND TRANSVAGINAL ULTRASOUND OF PELVIS DOPPLER ULTRASOUND OF OVARIES TECHNIQUE: Both transabdominal and transvaginal ultrasound examinations of the pelvis were performed. Transabdominal technique was performed for global imaging of the pelvis including uterus, ovaries, adnexal regions, and pelvic cul-de-sac. It was necessary to proceed with endovaginal exam following the transabdominal exam to visualize the uterus, endometrium, ovaries, and adnexal structures. Color and duplex Doppler ultrasound was utilized to evaluate blood flow to the ovaries. COMPARISON:  Outside pelvic ultrasound examination of May 04, 2018 FINDINGS: Uterus Measurements: 8.9 x 4.9 x 7.0 cm. No fibroids or other mass visualized. Endometrium Thickness: 8.7 mm.  No focal  abnormality visualized. Right ovary Measurements: 3.0 x 1.2 x 2.1 cm. Normal appearance/no adnexal mass. Developing follicles are present. Left ovary Measurements: 5.6 x 2.3 x 4.4 cm. There are cystic regions demonstrated within the left ovary exhibiting a few internal echoes. One measures 1.7 x 1.1 x 1.7 cm in the second measures 3.2 x 2.1 x 0.5 cm. Pulsed Doppler evaluation of both ovaries demonstrates normal low-resistance arterial and venous waveforms. Other findings Small amount of free pelvic fluid. IMPRESSION: Normal appearance of the uterus. An echogenic focus present in the endometrial cavity is consistent with the IUD. Normal endometrial stripe thickness. Normal vascularity of both ovaries. Cysts in the left ovary. The larger of these contains some internal echoes and has a thickened wall. This may reflect a hemorrhagic cyst. Follow-up ultrasound in 8-12 weeks is recommended. Electronically Signed   By: David  [place] M.D.   On: 07/05/2018 13:24    Procedures Pelvic exam  Date/Time: 07/05/2018 11:30 AM Performed by: Anselm Pancoast, PA-C Authorized by: Anselm Pancoast, PA-C  Consent: Verbal consent obtained. Risks and benefits: risks, benefits and alternatives were discussed Consent given by: patient Patient identity confirmed: verbally with patient and provided demographic data Local anesthesia used: no  Anesthesia: Local anesthesia used: no  Sedation: Patient sedated: no  Patient tolerance: Patient tolerated the procedure well with no immediate complications    (including critical care time)  Medications Ordered in ED Medications  ondansetron (ZOFRAN) injection 4 mg (4 mg Intravenous Not Given 07/05/18 1130)  sodium chloride 0.9 % bolus 1,000 mL (0 mLs Intravenous Stopped 07/05/18 1441)  iohexol (OMNIPAQUE) 300 MG/ML solution 100 mL (100 mLs Intravenous Contrast Given 07/05/18 1505)     Initial Impression / Assessment and Plan / ED Course  I have reviewed the triage vital signs  and the nursing notes.  Pertinent labs & imaging results that were available during my care of the patient were reviewed by me and considered in my medical decision making (see chart for details).  Clinical Course as of Jul 05 1612  Wed Jul 05, 2018  1412 Spoke with Dr. Ellyn Hack, patient's OB/GYN.  States the described onset of pain and persistent low level discomfort and cramping can be consistent with hemorrhagic cyst.  However, due to the patient's persistent tachycardia, she recommends repeat H&H and abdominal CT.  If CT negative, can discharge patient with NSAID regimen and they will follow-up with her in the office.   [SJ]  1555 Discussed CT imaging and repeat H&H with Dr. Ellyn Hack.  States CT findings are consistent with patient's pain coming from a hemorrhagic cyst.  Change in hemoglobin likely due to fluid bolus.  She states patient may be discharged.   [SJ]    Clinical Course User Index [SJ] Kadie Balestrieri C, PA-C    Patient presents with onset of lower abdominal pain.  Labs reassuring.  Evidence of likely hemorrhagic cyst noted on ultrasound.  CT obtained due to patient's tachycardia to assure no other alternative source.  Patient attributes her tachycardia to anxiety. Patient has close OB/GYN follow-up. The patient was given instructions for home care as well as return precautions. Patient voices understanding of these instructions, accepts the plan, and is comfortable with discharge.   Findings and plan of care discussed with Loren Racer, MD.   Vitals:   07/05/18 1415 07/05/18 1430 07/05/18 1445 07/05/18 1515  BP: 107/86 118/72 118/76 123/76  Pulse: 89 (!) 106 (!) 108 (!) 113  Resp: 14 12 15 17   Temp:      TempSrc:      SpO2: 100% 100% 100% 100%   I saw the patient right before discharge and noted her pulse to be 96.  Final Clinical Impressions(s) / ED Diagnoses   Final diagnoses:  Lower abdominal pain  Hemorrhagic cyst of left ovary    ED Discharge Orders    None         Concepcion Living 07/05/18 1613    Loren Racer, MD 07/13/18 2128

## 2018-07-05 NOTE — ED Notes (Signed)
Pt remains in ultrasound.

## 2018-07-05 NOTE — ED Notes (Signed)
The pt reports mild pain in her abd

## 2018-07-06 LAB — GC/CHLAMYDIA PROBE AMP (~~LOC~~) NOT AT ARMC
Chlamydia: NEGATIVE
NEISSERIA GONORRHEA: NEGATIVE

## 2018-07-10 ENCOUNTER — Encounter: Payer: Self-pay | Admitting: Family Medicine

## 2018-07-10 DIAGNOSIS — N83209 Unspecified ovarian cyst, unspecified side: Secondary | ICD-10-CM | POA: Insufficient documentation

## 2018-07-10 HISTORY — DX: Unspecified ovarian cyst, unspecified side: N83.209

## 2018-07-10 LAB — HM PAP SMEAR

## 2018-09-26 ENCOUNTER — Other Ambulatory Visit: Payer: Self-pay | Admitting: Obstetrics and Gynecology

## 2018-09-26 DIAGNOSIS — N6320 Unspecified lump in the left breast, unspecified quadrant: Secondary | ICD-10-CM

## 2018-10-04 ENCOUNTER — Ambulatory Visit
Admission: RE | Admit: 2018-10-04 | Discharge: 2018-10-04 | Disposition: A | Discharge status: Home / Self Care | Source: Ambulatory Visit | Attending: Obstetrics and Gynecology | Admitting: Obstetrics and Gynecology

## 2018-10-04 DIAGNOSIS — N6320 Unspecified lump in the left breast, unspecified quadrant: Secondary | ICD-10-CM

## 2019-01-17 ENCOUNTER — Other Ambulatory Visit: Payer: Self-pay | Admitting: Family Medicine

## 2019-03-09 ENCOUNTER — Encounter: Payer: Self-pay | Admitting: Family Medicine

## 2019-03-09 ENCOUNTER — Ambulatory Visit (INDEPENDENT_AMBULATORY_CARE_PROVIDER_SITE_OTHER): Admitting: Family Medicine

## 2019-03-09 ENCOUNTER — Other Ambulatory Visit: Payer: Self-pay

## 2019-03-09 VITALS — BP 118/70 | HR 94 | Temp 98.1°F | Resp 17 | Ht 62.5 in | Wt 121.4 lb

## 2019-03-09 DIAGNOSIS — R4582 Worries: Secondary | ICD-10-CM | POA: Diagnosis not present

## 2019-03-09 DIAGNOSIS — R000 Tachycardia, unspecified: Secondary | ICD-10-CM

## 2019-03-09 DIAGNOSIS — Z131 Encounter for screening for diabetes mellitus: Secondary | ICD-10-CM

## 2019-03-09 DIAGNOSIS — Z0000 Encounter for general adult medical examination without abnormal findings: Secondary | ICD-10-CM | POA: Diagnosis not present

## 2019-03-09 MED ORDER — ESCITALOPRAM OXALATE 10 MG PO TABS: 10.0000 mg | ORAL_TABLET | Freq: Every day | ORAL | 0 refills | Status: DC

## 2019-03-09 NOTE — Patient Instructions (Addendum)
Great to see you today.  Track your heart rate.  Hydrate.  Start lexapro and follow up in 2.5 months.  Health Maintenance, Female Adopting a healthy lifestyle and getting preventive care can go a long way to promote health and wellness. Talk with your health care provider about what schedule of regular examinations is right for you. This is a good chance for you to check in with your provider about disease prevention and staying healthy. In between checkups, there are plenty of things you can do on your own. Experts have done a lot of research about which lifestyle changes and preventive measures are most likely to keep you healthy. Ask your health care provider for more information. Weight and diet Eat a healthy diet  Be sure to include plenty of vegetables, fruits, low-fat dairy products, and lean protein.  Do not eat a lot of foods high in solid fats, added sugars, or salt.  Get regular exercise. This is one of the most important things you can do for your health. ? Most adults should exercise for at least 150 minutes each week. The exercise should increase your heart rate and make you sweat (moderate-intensity exercise). ? Most adults should also do strengthening exercises at least twice a week. This is in addition to the moderate-intensity exercise. Maintain a healthy weight  Body mass index (BMI) is a measurement that can be used to identify possible weight problems. It estimates body fat based on height and weight. Your health care provider can help determine your BMI and help you achieve or maintain a healthy weight.  For females 30 years of age and older: ? A BMI below 18.5 is considered underweight. ? A BMI of 18.5 to 24.9 is normal. ? A BMI of 25 to 29.9 is considered overweight. ? A BMI of 30 and above is considered obese. Watch levels of cholesterol and blood lipids  You should start having your blood tested for lipids and cholesterol at 30 years of age, then have this test  every 5 years.  You may need to have your cholesterol levels checked more often if: ? Your lipid or cholesterol levels are high. ? You are older than 30 years of age. ? You are at high risk for heart disease. Cancer screening Lung Cancer  Lung cancer screening is recommended for adults 71-1 years old who are at high risk for lung cancer because of a history of smoking.  A yearly low-dose CT scan of the lungs is recommended for people who: ? Currently smoke. ? Have quit within the past 15 years. ? Have at least a 30-pack-year history of smoking. A pack year is smoking an average of one pack of cigarettes a day for 1 year.  Yearly screening should continue until it has been 15 years since you quit.  Yearly screening should stop if you develop a health problem that would prevent you from having lung cancer treatment. Breast Cancer  Practice breast self-awareness. This means understanding how your breasts normally appear and feel.  It also means doing regular breast self-exams. Let your health care provider know about any changes, no matter how small.  If you are in your 20s or 30s, you should have a clinical breast exam (CBE) by a health care provider every 1-3 years as part of a regular health exam.  If you are 48 or older, have a CBE every year. Also consider having a breast X-ray (mammogram) every year.  If you have a family history of  breast cancer, talk to your health care provider about genetic screening.  If you are at high risk for breast cancer, talk to your health care provider about having an MRI and a mammogram every year.  Breast cancer gene (BRCA) assessment is recommended for women who have family members with BRCA-related cancers. BRCA-related cancers include: ? Breast. ? Ovarian. ? Tubal. ? Peritoneal cancers.  Results of the assessment will determine the need for genetic counseling and BRCA1 and BRCA2 testing. Cervical Cancer Your health care provider may  recommend that you be screened regularly for cancer of the pelvic organs (ovaries, uterus, and vagina). This screening involves a pelvic examination, including checking for microscopic changes to the surface of your cervix (Pap test). You may be encouraged to have this screening done every 3 years, beginning at age 60.  For women ages 47-65, health care providers may recommend pelvic exams and Pap testing every 3 years, or they may recommend the Pap and pelvic exam, combined with testing for human papilloma virus (HPV), every 5 years. Some types of HPV increase your risk of cervical cancer. Testing for HPV may also be done on women of any age with unclear Pap test results.  Other health care providers may not recommend any screening for nonpregnant women who are considered low risk for pelvic cancer and who do not have symptoms. Ask your health care provider if a screening pelvic exam is right for you.  If you have had past treatment for cervical cancer or a condition that could lead to cancer, you need Pap tests and screening for cancer for at least 20 years after your treatment. If Pap tests have been discontinued, your risk factors (such as having a new sexual partner) need to be reassessed to determine if screening should resume. Some women have medical problems that increase the chance of getting cervical cancer. In these cases, your health care provider may recommend more frequent screening and Pap tests. Colorectal Cancer  This type of cancer can be detected and often prevented.  Routine colorectal cancer screening usually begins at 30 years of age and continues through 30 years of age.  Your health care provider may recommend screening at an earlier age if you have risk factors for colon cancer.  Your health care provider may also recommend using home test kits to check for hidden blood in the stool.  A small camera at the end of a tube can be used to examine your colon directly  (sigmoidoscopy or colonoscopy). This is done to check for the earliest forms of colorectal cancer.  Routine screening usually begins at age 45.  Direct examination of the colon should be repeated every 5-10 years through 30 years of age. However, you may need to be screened more often if early forms of precancerous polyps or small growths are found. Skin Cancer  Check your skin from head to toe regularly.  Tell your health care provider about any new moles or changes in moles, especially if there is a change in a mole's shape or color.  Also tell your health care provider if you have a mole that is larger than the size of a pencil eraser.  Always use sunscreen. Apply sunscreen liberally and repeatedly throughout the day.  Protect yourself by wearing long sleeves, pants, a wide-brimmed hat, and sunglasses whenever you are outside. Heart disease, diabetes, and high blood pressure  High blood pressure causes heart disease and increases the risk of stroke. High blood pressure is more likely  to develop in: ? People who have blood pressure in the high end of the normal range (130-139/85-89 mm Hg). ? People who are overweight or obese. ? People who are African American.  If you are 59-12 years of age, have your blood pressure checked every 3-5 years. If you are 24 years of age or older, have your blood pressure checked every year. You should have your blood pressure measured twice-once when you are at a hospital or clinic, and once when you are not at a hospital or clinic. Record the average of the two measurements. To check your blood pressure when you are not at a hospital or clinic, you can use: ? An automated blood pressure machine at a pharmacy. ? A home blood pressure monitor.  If you are between 31 years and 38 years old, ask your health care provider if you should take aspirin to prevent strokes.  Have regular diabetes screenings. This involves taking a blood sample to check your  fasting blood sugar level. ? If you are at a normal weight and have a low risk for diabetes, have this test once every three years after 30 years of age. ? If you are overweight and have a high risk for diabetes, consider being tested at a younger age or more often. Preventing infection Hepatitis B  If you have a higher risk for hepatitis B, you should be screened for this virus. You are considered at high risk for hepatitis B if: ? You were born in a country where hepatitis B is common. Ask your health care provider which countries are considered high risk. ? Your parents were born in a high-risk country, and you have not been immunized against hepatitis B (hepatitis B vaccine). ? You have HIV or AIDS. ? You use needles to inject street drugs. ? You live with someone who has hepatitis B. ? You have had sex with someone who has hepatitis B. ? You get hemodialysis treatment. ? You take certain medicines for conditions, including cancer, organ transplantation, and autoimmune conditions. Hepatitis C  Blood testing is recommended for: ? Everyone born from [birth year] through 1965. ? Anyone with known risk factors for hepatitis C. Sexually transmitted infections (STIs)  You should be screened for sexually transmitted infections (STIs) including gonorrhea and chlamydia if: ? You are sexually active and are younger than 30 years of age. ? You are older than 30 years of age and your health care provider tells you that you are at risk for this type of infection. ? Your sexual activity has changed since you were last screened and you are at an increased risk for chlamydia or gonorrhea. Ask your health care provider if you are at risk.  If you do not have HIV, but are at risk, it may be recommended that you take a prescription medicine daily to prevent HIV infection. This is called pre-exposure prophylaxis (PrEP). You are considered at risk if: ? You are sexually active and do not regularly use condoms or  know the HIV status of your partner(s). ? You take drugs by injection. ? You are sexually active with a partner who has HIV. Talk with your health care provider about whether you are at high risk of being infected with HIV. If you choose to begin PrEP, you should first be tested for HIV. You should then be tested every 3 months for as long as you are taking PrEP. Pregnancy  If you are premenopausal and you may become pregnant, ask your  health care provider about preconception counseling.  If you may become pregnant, take 400 to 800 micrograms (mcg) of folic acid every day.  If you want to prevent pregnancy, talk to your health care provider about birth control (contraception). Osteoporosis and menopause  Osteoporosis is a disease in which the bones lose minerals and strength with aging. This can result in serious bone fractures. Your risk for osteoporosis can be identified using a bone density scan.  If you are 22 years of age or older, or if you are at risk for osteoporosis and fractures, ask your health care provider if you should be screened.  Ask your health care provider whether you should take a calcium or vitamin D supplement to lower your risk for osteoporosis.  Menopause may have certain physical symptoms and risks.  Hormone replacement therapy may reduce some of these symptoms and risks. Talk to your health care provider about whether hormone replacement therapy is right for you. Follow these instructions at home:  Schedule regular health, dental, and eye exams.  Stay current with your immunizations.  Do not use any tobacco products including cigarettes, chewing tobacco, or electronic cigarettes.  If you are pregnant, do not drink alcohol.  If you are breastfeeding, limit how much and how often you drink alcohol.  Limit alcohol intake to no more than 1 drink per day for nonpregnant women. One drink equals 12 ounces of beer, 5 ounces of wine, or 1 ounces of hard  liquor.  Do not use street drugs.  Do not share needles.  Ask your health care provider for help if you need support or information about quitting drugs.  Tell your health care provider if you often feel depressed.  Tell your health care provider if you have ever been abused or do not feel safe at home. This information is not intended to replace advice given to you by your health care provider. Make sure you discuss any questions you have with your health care provider. Document Released: 03/22/2011 Document Revised: 02/12/2016 Document Reviewed: 06/10/2015 Elsevier Interactive Patient Education  2019 [employer].

## 2019-03-09 NOTE — Progress Notes (Signed)
Patient ID: Shari [name], female  DOB: 05-27-1991, 28 y.o.   MRN: [id number] Patient Care Team    Relationship Specialty Notifications Start End  Ma Hillock, DO PCP - General Family Medicine  08/22/15   [name], MD Consulting Physician Obstetrics and Gynecology  03/01/18   Specialists, Dermatology  Dermatology  03/01/18     Chief Complaint  Patient presents with  . Annual Exam    No complaints.  Pt reports increase in allergies and anxiety due to COVID 19     Subjective:  Shari [name] is a 28 y.o.  Female  present for CPE. All past medical history, surgical history, allergies, family history, immunizations, medications and social history were updated in the electronic medical record today. All recent labs, ED visits and hospitalizations within the last year were reviewed. Anxiety/tachycardia:  Pt reports increase in anxiety and worry. With pandemic and her husband a cop, she is worrying more as of late. She would like to try a medication to help her.  Health maintenance:  Colonoscopy: No FHX, routine screen at 50 Mammogram: No fhx, routine screen at 61, Has OB/GYN Cervical cancer screening: 06/2018 completed, Follows with Ob/GYN, no abnl PAP  Patient's last menstrual period was 02/18/2019 (exact date). Immunizations: Flu UTD through [facility]. Tdap UTD 2015 Infectious disease screening: HIV screen completed, Negative Assistive device: none Oxygen ZJI:RCVE Patient has a Dental home. Hospitalizations/ED visits: reviewed   Depression screen [facility] 2/9 03/09/2019 03/01/2018 02/16/2018  Decreased Interest 0 0 0  Down, Depressed, Hopeless 0 0 0  PHQ - 2 Score 0 0 0  Altered sleeping 0 - -  Tired, decreased energy 2 - -  Change in appetite 0 - -  Feeling bad or failure about yourself  0 - -  Trouble concentrating 0 - -  Moving slowly or fidgety/restless 0 - -  Suicidal thoughts 0 - -  PHQ-9 Score 2 - -  Difficult doing work/chores Not difficult at all - -   GAD  7 : Generalized Anxiety Score 03/09/2019  Nervous, Anxious, on Edge 2  Control/stop worrying 2  Worry too much - different things 2  Trouble relaxing 2  Restless 0  Easily annoyed or irritable 1  Afraid - awful might happen 2  Total GAD 7 Score 11  Anxiety Difficulty Not difficult at all     Immunization History  Administered Date(s) Administered  . Influenza-Unspecified 04/21/2015, 06/20/2017, 06/19/2018  . MMR 04/20/2014  . Tdap 12/19/2013, 01/29/2014     Past Medical History:  Diagnosis Date  . Anemia    after delivery  . Back skin lesion 09/05/2015  . Cholestasis during pregnancy   . Diastasis recti 03/01/2018  . Hemorrhagic cyst of ovary 07/10/2018  . Lesion of vulva 06/05/2018  . Medical history non-contributory   . Normal pregnancy 04/18/2014  . SVD (spontaneous vaginal delivery) 04/19/2014  . SVD (spontaneous vaginal delivery) 12/28/2016   No Known Allergies Past Surgical History:  Procedure Laterality Date  . BREAST SURGERY Left    cyst aspiration  . WISDOM TOOTH EXTRACTION     Family History  Problem Relation Age of Onset  . Heart disease Father   . Hashimoto's thyroiditis Sister   . Heart disease Maternal Grandfather    Social History   Social History Narrative   Married. Husband's names Darnelle Maffucci. One child (female) [name].   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 Takes a daily vitamin.  Wears her seatbelt. Smoke detector located in the home. Firearms located in the home and a locked.   Feels safe in her relationships.     Allergies as of 03/09/2019   No Known Allergies     Medication List       Accurate as of March 09, 2019  2:35 PM. If you have any questions, ask your nurse or doctor.        STOP taking these medications   ibuprofen 600 MG tablet Commonly known as: ADVIL Stopped by: [name], DO   prenatal multivitamin Tabs tablet Stopped by: [name], DO     TAKE these medications    escitalopram 10 MG tablet Commonly known as: Lexapro Take 1 tablet (10 mg total) by mouth daily. Started by: [name], DO   fluticasone 50 MCG/ACT nasal spray Commonly known as: FLONASE Place 2 sprays into both nostrils daily for 10 days.   Vitamin D (Cholecalciferol) 10 MCG (400 UNIT) Caps Take 1 tablet by mouth as needed.       All past medical history, surgical history, allergies, family history, immunizations andmedications were updated in the EMR today and reviewed under the history and medication portions of their EMR.     No results found for this or any previous visit (from the past 2160 hour(s)).  US Breast Ltd Uni Left Inc Axilla  Result Date: 10/04/2018 CLINICAL DATA:  28 year old patient palpates a lumpy ridgelike area in the left breast 3 o'clock position retroareolar, approximately 1 cm from the nipple. EXAM: ULTRASOUND OF THE LEFT BREAST COMPARISON:  Previous exam(s). FINDINGS: On physical exam, I palpate a small ridge of fibroglandular tissue in the left breast 3 o'clock position 1 cm from the nipple. I do not palpate a discrete mass. The skin and nipple areolar complex appear normal. Targeted ultrasound is performed, showing normal appearing dense fibroglandular tissue the region of palpable concern. No solid or cystic mass or abnormal shadowing is identified. IMPRESSION: No sonographic evidence of malignancy in the region of patient concern left breast. RECOMMENDATION: Screening mammogram at age 5 unless there are persistent or intervening clinical concerns. (Code:SM-B-40A) I have discussed the findings and recommendations with the patient. Results were also provided in writing at the conclusion of the visit. If applicable, a reminder letter will be sent to the patient regarding the next appointment. BI-RADS CATEGORY  1: Negative. Electronically Signed   By: Curlene Dolphin M.D.   On: 10/04/2018 07:53     ROS: 14 pt review of systems performed and negative (unless  mentioned in an HPI)  Objective: BP 118/70 (BP Location: Right Arm, Patient Position: Sitting, Cuff Size: Normal)   Pulse (!) 114   Temp 98.1 F (36.7 C) (Oral)   Resp 17   Ht 5' 2.5" (1.588 m)   Wt 121 lb 6 oz (55.1 kg)   LMP 02/18/2019 (Exact Date)   SpO2 100%   BMI 21.85 kg/m  Gen: Afebrile. No acute distress. Nontoxic in appearance, well-developed, well-nourished,  pleasant caucasian female.  HENT: AT. . Bilateral TM visualized and normal in appearance, normal external auditory canal. MMM, no oral lesions, adequate dentition. Bilateral nares within normal limits. Throat without erythema, ulcerations or exudates. no Cough on exam, no hoarseness on exam. Eyes:Pupils Equal Round Reactive to light, Extraocular movements intact,  Conjunctiva without redness, discharge or icterus. Neck/lymp/endocrine: Supple,no lymphadenopathy, no thyromegaly CV: RRR no murmur, no edema, +2/4 P posterior tibialis pulses. no carotid bruits. No JVD. Chest: CTAB, no wheeze, rhonchi  or crackles. normal Respiratory effort. good Air movement. Abd: Soft. flat. NTND. BS present. no Masses palpated. No hepatosplenomegaly. No rebound tenderness or guarding. Skin: no rashes, purpura or petechiae. Warm and well-perfused. Skin intact. Neuro/Msk:  Normal gait. PERLA. EOMi. Alert. Oriented x3.  Cranial nerves II through XII intact. Muscle strength 5/5 upper/lower extremity. DTRs equal bilaterally. Psych: Normal affect, dress and demeanor. Normal speech. Normal thought content and judgment.  No exam data present  Assessment/plan: TEIRA ARCILLA is a 28 y.o. female present for CPE Encounter for preventive health examination Patient was encouraged to exercise greater than 150 minutes a week. Patient was encouraged to choose a diet filled with fresh fruits and vegetables, and lean meats. AVS provided to patient today for education/recommendation on gender specific health and safety maintenance. Colonoscopy: No FHX,  routine screen at 50 Mammogram: No fhx, routine screen at 38, Has OB/GYN Cervical cancer screening: 06/2018 completed, Follows with Ob/GYN, no abnl PAP  Patient's last menstrual period was 02/18/2019 (exact date). Immunizations: Flu UTD through [facility]. Tdap UTD 2015 Infectious disease screening: HIV screen completed, Negative Diabetes mellitus screening - Hemoglobin A1c Worries/tachycardia - hydrate- monitor. Rpt 93 - CBC - Comp Met (CMET) - TSH  Return in about 1 year (around 03/08/2020) for CPE (30 min).  Electronically signed by: [name], DO [place]

## 2019-03-10 LAB — CBC
HCT: 38 % (ref 35.0–45.0)
Hemoglobin: 13.1 g/dL (ref 11.7–15.5)
MCH: 31.6 pg (ref 27.0–33.0)
MCHC: 34.5 g/dL (ref 32.0–36.0)
MCV: 91.8 fL (ref 80.0–100.0)
MPV: 10.3 fL (ref 7.5–12.5)
Platelets: 301 10*3/uL (ref 140–400)
RBC: 4.14 10*6/uL (ref 3.80–5.10)
RDW: 11.8 % (ref 11.0–15.0)
WBC: 5.9 10*3/uL (ref 3.8–10.8)

## 2019-03-10 LAB — COMPREHENSIVE METABOLIC PANEL
AG Ratio: 1.7 (calc) (ref 1.0–2.5)
ALT: 7 U/L (ref 6–29)
AST: 14 U/L (ref 10–30)
Albumin: 4.7 g/dL (ref 3.6–5.1)
Alkaline phosphatase (APISO): 15 U/L — ABNORMAL LOW (ref 31–125)
BUN: 13 mg/dL (ref 7–25)
CO2: 19 mmol/L — ABNORMAL LOW (ref 20–32)
Calcium: 9.2 mg/dL (ref 8.6–10.2)
Chloride: 105 mmol/L (ref 98–110)
Creat: 0.78 mg/dL (ref 0.50–1.10)
Globulin: 2.8 g/dL (calc) (ref 1.9–3.7)
Glucose, Bld: 87 mg/dL (ref 65–99)
Potassium: 3.9 mmol/L (ref 3.5–5.3)
Sodium: 137 mmol/L (ref 135–146)
Total Bilirubin: 0.7 mg/dL (ref 0.2–1.2)
Total Protein: 7.5 g/dL (ref 6.1–8.1)

## 2019-03-10 LAB — HEMOGLOBIN A1C
Hgb A1c MFr Bld: 4.8 % of total Hgb (ref <5.7)
Mean Plasma Glucose: 91 (calc)
eAG (mmol/L): 5 (calc)

## 2019-03-10 LAB — TSH: TSH: 0.92 mIU/L

## 2019-05-24 ENCOUNTER — Ambulatory Visit (INDEPENDENT_AMBULATORY_CARE_PROVIDER_SITE_OTHER): Admitting: Family Medicine

## 2019-05-24 ENCOUNTER — Other Ambulatory Visit: Payer: Self-pay

## 2019-05-24 ENCOUNTER — Encounter: Payer: Self-pay | Admitting: Family Medicine

## 2019-05-24 VITALS — BP 110/71 | HR 92 | Temp 98.2°F | Resp 19 | Ht 63.0 in | Wt 119.2 lb

## 2019-05-24 DIAGNOSIS — R4582 Worries: Secondary | ICD-10-CM

## 2019-05-24 DIAGNOSIS — J301 Allergic rhinitis due to pollen: Secondary | ICD-10-CM

## 2019-05-24 MED ORDER — ESCITALOPRAM OXALATE 10 MG PO TABS: 10.0000 mg | ORAL_TABLET | Freq: Every day | ORAL | 1 refills | Status: DC

## 2019-05-24 MED ORDER — FLUTICASONE PROPIONATE 50 MCG/ACT NA SUSP: 2.0000 | Freq: Every day | NASAL | 0 refills | Status: DC

## 2019-05-24 NOTE — Progress Notes (Signed)
Shari [name] , November 05, 1990, 28 y.o., female MRN: [id number] Patient Care Team    Relationship Specialty Notifications Start End  Ma Hillock, DO PCP - General Family Medicine  08/22/15   Shari Contes, MD Consulting Physician Obstetrics and Gynecology  03/01/18   Specialists, Dermatology  Dermatology  03/01/18     Chief Complaint  Patient presents with  . Anxiety    Medication is doing well and pt has no complaints. Pt needs refills on Lexapro and Flonase. Will get flu shot at work, declines today     Subjective: Pt presents for follow up on anxiety after med start Anxiety/tachycardia:  Pt reports she is doing really well after starting lexapro 10 mg QD. She has noticed she is handle ing life changes much better. She does not think she would have been able to handle her husbands deployment without it. She denies any SE.  Prior note: Pt reports increase in anxiety and worry. With pandemic and her husband a cop, she is worrying more as of late. She would like to try a medication to help her  Depression screen [facility] 2/9 05/24/2019 03/09/2019 03/01/2018 02/16/2018  Decreased Interest 0 0 0 0  Down, Depressed, Hopeless 0 0 0 0  PHQ - 2 Score 0 0 0 0  Altered sleeping 0 0 - -  Tired, decreased energy 1 2 - -  Change in appetite 0 0 - -  Feeling bad or failure about yourself  0 0 - -  Trouble concentrating 0 0 - -  Moving slowly or fidgety/restless 0 0 - -  Suicidal thoughts 0 0 - -  PHQ-9 Score 1 2 - -  Difficult doing work/chores Not difficult at all Not difficult at all - -   GAD 7 : Generalized Anxiety Score 05/24/2019 03/09/2019  Nervous, Anxious, on Edge 1 2  Control/stop worrying 0 2  Worry too much - different things 0 2  Trouble relaxing 0 2  Restless 0 0  Easily annoyed or irritable 0 1  Afraid - awful might happen 0 2  Total GAD 7 Score 1 11  Anxiety Difficulty Not difficult at all Not difficult at all    No Known Allergies Social History   Social History  Narrative   Married. Husband's names Shari [name]. One child (female) Shari [name].   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 Takes a daily vitamin.   Wears her seatbelt. Smoke detector located in the home. Firearms located in the home and a locked.   Feels safe in her relationships.    Past Medical History:  Diagnosis Date  . Anemia    after delivery  . Back skin lesion 09/05/2015  . Cholestasis during pregnancy   . Diastasis recti 03/01/2018  . Hemorrhagic cyst of ovary 07/10/2018  . Lesion of vulva 06/05/2018  . Medical history non-contributory   . Normal pregnancy 04/18/2014  . SVD (spontaneous vaginal delivery) 04/19/2014  . SVD (spontaneous vaginal delivery) 12/28/2016   Past Surgical History:  Procedure Laterality Date  . BREAST SURGERY Left    cyst aspiration  . WISDOM TOOTH EXTRACTION     Family History  Problem Relation Age of Onset  . Heart disease Father   . Hashimoto's thyroiditis Sister   . Heart disease Maternal Grandfather    Allergies as of 05/24/2019   No Known Allergies     Medication List       Accurate as of May 24, 2019  9:55 AM. If you have any questions, ask your nurse or doctor.        escitalopram 10 MG tablet Commonly known as: Lexapro Take 1 tablet (10 mg total) by mouth daily.   fluticasone 50 MCG/ACT nasal spray Commonly known as: FLONASE Place 2 sprays into both nostrils daily for 10 days.   Vitamin D (Cholecalciferol) 10 MCG (400 UNIT) Caps Take 1 tablet by mouth as needed. 1,000 daily       All past medical history, surgical history, allergies, family history, immunizations andmedications were updated in the EMR today and reviewed under the history and medication portions of their EMR.     ROS: Negative, with the exception of above mentioned in HPI   Objective:  BP 110/71 (BP Location: Right Arm, Patient Position: Sitting, Cuff Size: Normal)   Pulse 92   Temp 98.2 F (36.8 C) (Temporal)   Resp 19    Ht 5\' 3"  (1.6 m)   Wt 119 lb 4 oz (54.1 kg)   LMP 05/04/2019 (Exact Date)   SpO2 100%   BMI 21.12 kg/m  Body mass index is 21.12 kg/m. Gen: Afebrile. No acute distress. Nontoxic in appearance, well developed, well nourished.  HENT: AT. [name]. Psych: Normal affect, dress and demeanor. Normal speech. Normal thought content and judgment.  No exam data present No results found. No results found for this or any previous visit (from the past 24 hour(s)).  Assessment/Plan: Shari [name] is a 28 y.o. female present for OV for  Worries - doing well on lexapro 10 mg. Refilled today - f/u 6 months  Allergic rhinitis due to pollen, unspecified seasonality Stable.  Refilled flonase. F/u PRN   Reviewed expectations re: course of current medical issues.  Discussed self-management of symptoms.  Outlined signs and symptoms indicating need for more acute intervention.  Patient verbalized understanding and all questions were answered.  Patient received an After-Visit Summary.    No orders of the defined types were placed in this encounter.   > 15 minutes spent with patient, > 50% of that time face to face  Note is dictated utilizing voice recognition software. Although note has been proof read prior to signing, occasional typographical errors still can be missed. If any questions arise, please do not hesitate to call for verification.   electronically signed by:  Felix Pacinienee Kuneff, DO  [facility] - [place]

## 2019-05-24 NOTE — Patient Instructions (Signed)
I am glad you are doing better.  Follow up in 6 months, sooner if needed.

## 2019-08-09 ENCOUNTER — Encounter: Payer: Self-pay | Admitting: Family Medicine

## 2019-11-13 ENCOUNTER — Other Ambulatory Visit: Payer: Self-pay | Admitting: Family Medicine

## 2019-12-07 ENCOUNTER — Ambulatory Visit: Attending: Internal Medicine

## 2019-12-07 DIAGNOSIS — Z23 Encounter for immunization: Secondary | ICD-10-CM

## 2019-12-07 NOTE — Progress Notes (Signed)
   Covid-19 Vaccination Clinic  Name:  Shari [name]    MRN: [id number] DOB: [birth year]  12/07/2019  Shari [name] was observed post Covid-19 immunization for 15 minutes without incident. She was provided with Vaccine Information Sheet and instruction to access the V-Safe system.   Shari [name] was instructed to call 911 with any severe reactions post vaccine: Marland Kitchen Difficulty breathing  . Swelling of face and throat  . A fast heartbeat  . A bad rash all over body  . Dizziness and weakness   Immunizations Administered    Name Date Dose VIS Date Route   Pfizer COVID-19 Vaccine 12/07/2019 11:31 AM 0.3 mL 08/31/2019 Intramuscular   Manufacturer: [employer], [employer]   Lot: [id number]   NDC: [address]-3

## 2020-02-04 ENCOUNTER — Ambulatory Visit (INDEPENDENT_AMBULATORY_CARE_PROVIDER_SITE_OTHER)
Admission: RE | Admit: 2020-02-04 | Discharge: 2020-02-04 | Disposition: A | Discharge status: Home / Self Care | Source: Ambulatory Visit

## 2020-02-04 DIAGNOSIS — J302 Other seasonal allergic rhinitis: Secondary | ICD-10-CM

## 2020-02-04 DIAGNOSIS — J069 Acute upper respiratory infection, unspecified: Secondary | ICD-10-CM

## 2020-02-04 MED ORDER — IBUPROFEN 600 MG PO TABS: 600.0000 mg | ORAL_TABLET | Freq: Four times a day (QID) | ORAL | 0 refills | Status: DC | PRN

## 2020-02-04 MED ORDER — MUCINEX 600 MG PO TB12: 1200.0000 mg | ORAL_TABLET | Freq: Two times a day (BID) | ORAL | 0 refills | Status: DC | PRN

## 2020-02-04 NOTE — Discharge Instructions (Addendum)
Take the Mucinex and ibuprofen as directed.    Follow up with your primary care provider or come here to be seen in person if your symptoms are not improving.

## 2020-02-04 NOTE — ED Provider Notes (Signed)
Virtual Visit via Video Note:  Shari [name]  initiated request for Telemedicine visit with [facility] team. I connected with Shari [name]  on 02/04/2020 at 12:42 PM  for a synchronized telemedicine visit using a video enabled HIPPA compliant telemedicine application. I verified that I am speaking with Shari [name]  using two identifiers. Sharion Balloon, NP  was physically located in a [facility] Urgent care site and Shari [name] was located at a different location.   The limitations of evaluation and management by telemedicine as well as the availability of in-person appointments were discussed. Patient was informed that she  may incur a bill ( including co-pay) for this virtual visit encounter. Shari [name]  expressed understanding and gave verbal consent to proceed with virtual visit.     History of Present Illness:Shari [name]  is a 29 y.o. female presents for evaluation of sinus congestion and sinus headache x 1 day.  Treatment attempted with Claritin and Afrin.  She denies fever, chills, ear ache, sore throat, cough, shortness of breath, vomiting, diarrhea, rash, or other symptoms.  She denies current pregnancy or breastfeeding.      No Known Allergies   Past Medical History:  Diagnosis Date  . Anemia    after delivery  . Back skin lesion 09/05/2015  . Cholestasis during pregnancy   . Diastasis recti 03/01/2018  . Hemorrhagic cyst of ovary 07/10/2018  . Lesion of vulva 06/05/2018  . Medical history non-contributory   . Normal pregnancy 04/18/2014  . SVD (spontaneous vaginal delivery) 04/19/2014  . SVD (spontaneous vaginal delivery) 12/28/2016     Social History   Tobacco Use  . Smoking status: Never Smoker  . Smokeless tobacco: Never Used  Substance Use Topics  . Alcohol use: No  . Drug use: No   ROS: as stated in HPI.  All other systems reviewed and negative.      Observations/Objective: Physical Exam  VITALS: Patient denies  fever. GENERAL: Alert, appears well and in no acute distress. HEENT: Atraumatic. NECK: Normal movements of the head and neck. CARDIOPULMONARY: No increased WOB. Speaking in clear sentences. I:E ratio WNL.  MS: Moves all visible extremities without noticeable abnormality. PSYCH: Pleasant and cooperative, well-groomed. Speech normal rate and rhythm. Affect is appropriate. Insight and judgement are appropriate. Attention is focused, linear, and appropriate.  NEURO: CN grossly intact. Oriented as arrived to appointment on time with no prompting. Moves both UE equally.  SKIN: No obvious lesions, wounds, erythema, or cyanosis noted on face or hands.   Assessment and Plan:    ICD-10-CM   1. Upper respiratory tract infection, unspecified type  J06.9   2. Seasonal allergies  J30.2        Follow Up Instructions: Treating with ibuprofen and Mucinex.  Instructed patient to continue using her Flonase.  Instructed her to follow-up with her PCP or come here to be seen in person if her symptoms or not improving.  Patient agrees to plan of care.      I discussed the assessment and treatment plan with the patient. The patient was provided an opportunity to ask questions and all were answered. The patient agreed with the plan and demonstrated an understanding of the instructions.   The patient was advised to call back or seek an in-person evaluation if the symptoms worsen or if the condition fails to improve as anticipated.      Sharion Balloon, NP  02/04/2020 12:42 PM  Mickie Bail, NP 02/04/20 1242

## 2020-03-12 ENCOUNTER — Ambulatory Visit (INDEPENDENT_AMBULATORY_CARE_PROVIDER_SITE_OTHER): Admitting: Family Medicine

## 2020-03-12 ENCOUNTER — Encounter: Payer: Self-pay | Admitting: Family Medicine

## 2020-03-12 ENCOUNTER — Other Ambulatory Visit: Payer: Self-pay

## 2020-03-12 VITALS — BP 116/73 | HR 82 | Temp 98.6°F | Resp 18 | Ht 62.5 in | Wt 129.2 lb

## 2020-03-12 DIAGNOSIS — Z131 Encounter for screening for diabetes mellitus: Secondary | ICD-10-CM | POA: Diagnosis not present

## 2020-03-12 DIAGNOSIS — Z130 Encounter for screening for diseases of the blood and blood-forming organs and certain disorders involving the immune mechanism: Secondary | ICD-10-CM | POA: Diagnosis not present

## 2020-03-12 DIAGNOSIS — Z0000 Encounter for general adult medical examination without abnormal findings: Secondary | ICD-10-CM

## 2020-03-12 DIAGNOSIS — R4582 Worries: Secondary | ICD-10-CM | POA: Diagnosis not present

## 2020-03-12 DIAGNOSIS — Z79899 Other long term (current) drug therapy: Secondary | ICD-10-CM | POA: Diagnosis not present

## 2020-03-12 DIAGNOSIS — Z13220 Encounter for screening for lipoid disorders: Secondary | ICD-10-CM | POA: Diagnosis not present

## 2020-03-12 LAB — COMPREHENSIVE METABOLIC PANEL WITH GFR
ALT: 8 U/L (ref 0–35)
AST: 14 U/L (ref 0–37)
Albumin: 4.7 g/dL (ref 3.5–5.2)
Alkaline Phosphatase: 15 U/L — ABNORMAL LOW (ref 39–117)
BUN: 13 mg/dL (ref 6–23)
CO2: 27 meq/L (ref 19–32)
Calcium: 9.2 mg/dL (ref 8.4–10.5)
Chloride: 102 meq/L (ref 96–112)
Creatinine, Ser: 0.79 mg/dL (ref 0.40–1.20)
GFR: 84.68 mL/min
Glucose, Bld: 86 mg/dL (ref 70–99)
Potassium: 3.8 meq/L (ref 3.5–5.1)
Sodium: 136 meq/L (ref 135–145)
Total Bilirubin: 0.6 mg/dL (ref 0.2–1.2)
Total Protein: 7.4 g/dL (ref 6.0–8.3)

## 2020-03-12 LAB — CBC
HCT: 37.6 % (ref 36.0–46.0)
Hemoglobin: 12.9 g/dL (ref 12.0–15.0)
MCHC: 34.3 g/dL (ref 30.0–36.0)
MCV: 91.1 fl (ref 78.0–100.0)
Platelets: 263 10*3/uL (ref 150.0–400.0)
RBC: 4.12 Mil/uL (ref 3.87–5.11)
RDW: 12.5 % (ref 11.5–15.5)
WBC: 4.5 10*3/uL (ref 4.0–10.5)

## 2020-03-12 LAB — TSH: TSH: 1.26 u[IU]/mL (ref 0.35–4.50)

## 2020-03-12 LAB — LIPID PANEL
Cholesterol: 185 mg/dL (ref 0–200)
HDL: 68.8 mg/dL (ref >39.00)
LDL Cholesterol: 106 mg/dL — ABNORMAL HIGH (ref 0–99)
NonHDL: 116.59
Total CHOL/HDL Ratio: 3
Triglycerides: 54 mg/dL (ref 0.0–149.0)
VLDL: 10.8 mg/dL (ref 0.0–40.0)

## 2020-03-12 LAB — HEMOGLOBIN A1C: Hgb A1c MFr Bld: 5 % (ref 4.6–6.5)

## 2020-03-12 MED ORDER — ESCITALOPRAM OXALATE 10 MG PO TABS: 10.0000 mg | ORAL_TABLET | Freq: Every day | ORAL | 1 refills | Status: DC

## 2020-03-12 NOTE — Patient Instructions (Signed)
Health Maintenance, Female Adopting a healthy lifestyle and getting preventive care are important in promoting health and wellness. Ask your health care provider about:  The right schedule for you to have regular tests and exams.  Things you can do on your own to prevent diseases and keep yourself healthy. What should I know about diet, weight, and exercise? Eat a healthy diet   Eat a diet that includes plenty of vegetables, fruits, low-fat dairy products, and lean protein.  Do not eat a lot of foods that are high in solid fats, added sugars, or sodium. Maintain a healthy weight Body mass index (BMI) is used to identify weight problems. It estimates body fat based on height and weight. Your health care provider can help determine your BMI and help you achieve or maintain a healthy weight. Get regular exercise Get regular exercise. This is one of the most important things you can do for your health. Most adults should:  Exercise for at least 150 minutes each week. The exercise should increase your heart rate and make you sweat (moderate-intensity exercise).  Do strengthening exercises at least twice a week. This is in addition to the moderate-intensity exercise.  Spend less time sitting. Even light physical activity can be beneficial. Watch cholesterol and blood lipids Have your blood tested for lipids and cholesterol at 31 years of age, then have this test every 5 years. Have your cholesterol levels checked more often if:  Your lipid or cholesterol levels are high.  You are older than 31 years of age.  You are at high risk for heart disease. What should I know about cancer screening? Depending on your health history and family history, you may need to have cancer screening at various ages. This may include screening for:  Breast cancer.  Cervical cancer.  Colorectal cancer.  Skin cancer.  Lung cancer. What should I know about heart disease, diabetes, and high blood  pressure? Blood pressure and heart disease  High blood pressure causes heart disease and increases the risk of stroke. This is more likely to develop in people who have high blood pressure readings, are of African descent, or are overweight.  Have your blood pressure checked: ? Every 3-5 years if you are 18-39 years of age. ? Every year if you are 40 years old or older. Diabetes Have regular diabetes screenings. This checks your fasting blood sugar level. Have the screening done:  Once every three years after age 40 if you are at a normal weight and have a low risk for diabetes.  More often and at a younger age if you are overweight or have a high risk for diabetes. What should I know about preventing infection? Hepatitis B If you have a higher risk for hepatitis B, you should be screened for this virus. Talk with your health care provider to find out if you are at risk for hepatitis B infection. Hepatitis C Testing is recommended for:  Everyone born from 1945 through 1965.  Anyone with known risk factors for hepatitis C. Sexually transmitted infections (STIs)  Get screened for STIs, including gonorrhea and chlamydia, if: ? You are sexually active and are younger than 31 years of age. ? You are older than 31 years of age and your health care provider tells you that you are at risk for this type of infection. ? Your sexual activity has changed since you were last screened, and you are at increased risk for chlamydia or gonorrhea. Ask your health care provider if   you are at risk.  Ask your health care provider about whether you are at high risk for HIV. Your health care provider may recommend a prescription medicine to help prevent HIV infection. If you choose to take medicine to prevent HIV, you should first get tested for HIV. You should then be tested every 3 months for as long as you are taking the medicine. Pregnancy  If you are about to stop having your period (premenopausal) and  you may become pregnant, seek counseling before you get pregnant.  Take 400 to 800 micrograms (mcg) of folic acid every day if you become pregnant.  Ask for birth control (contraception) if you want to prevent pregnancy. Osteoporosis and menopause Osteoporosis is a disease in which the bones lose minerals and strength with aging. This can result in bone fractures. If you are 65 years old or older, or if you are at risk for osteoporosis and fractures, ask your health care provider if you should:  Be screened for bone loss.  Take a calcium or vitamin D supplement to lower your risk of fractures.  Be given hormone replacement therapy (HRT) to treat symptoms of menopause. Follow these instructions at home: Lifestyle  Do not use any products that contain nicotine or tobacco, such as cigarettes, e-cigarettes, and chewing tobacco. If you need help quitting, ask your health care provider.  Do not use street drugs.  Do not share needles.  Ask your health care provider for help if you need support or information about quitting drugs. Alcohol use  Do not drink alcohol if: ? Your health care provider tells you not to drink. ? You are pregnant, may be pregnant, or are planning to become pregnant.  If you drink alcohol: ? Limit how much you use to 0-1 drink a day. ? Limit intake if you are breastfeeding.  Be aware of how much alcohol is in your drink. In the [place] one drink equals one 12 oz bottle of beer (355 mL), one 5 oz glass of wine (148 mL), or one 1 oz glass of hard liquor (44 mL). General instructions  Schedule regular health, dental, and eye exams.  Stay current with your vaccines.  Tell your health care provider if: ? You often feel depressed. ? You have ever been abused or do not feel safe at home. Summary  Adopting a healthy lifestyle and getting preventive care are important in promoting health and wellness.  Follow your health care provider's instructions about healthy  diet, exercising, and getting tested or screened for diseases.  Follow your health care provider's instructions on monitoring your cholesterol and blood pressure. This information is not intended to replace advice given to you by your health care provider. Make sure you discuss any questions you have with your health care provider. Document Revised: 08/30/2018 Document Reviewed: 08/30/2018 Elsevier Patient Education  2020 [employer]  

## 2020-03-12 NOTE — Progress Notes (Signed)
This visit occurred during the SARS-CoV-2 public health emergency.  Safety protocols were in place, including screening questions prior to the visit, additional usage of staff PPE, and extensive cleaning of exam room while observing appropriate contact time as indicated for disinfecting solutions.    Patient ID: Shari [name], female  DOB: April 05, 1991, 29 y.o.   MRN: [id number] Patient Care Team    Relationship Specialty Notifications Start End  Ma Hillock, DO PCP - General Family Medicine  08/22/15   [name], MD Consulting Physician Obstetrics and Gynecology  03/01/18   Specialists, Dermatology  Dermatology  03/01/18     Chief Complaint  Patient presents with  . Annual Exam    Fasting. Pap smear-07/10/2018.     Subjective: Shari [name] is a 29 y.o.  Female  present for CPE. All past medical history, surgical history, allergies, family history, immunizations, medications and social history were updated in the electronic medical record today. All recent labs, ED visits and hospitalizations within the last year were reviewed.  Health maintenance:  Colonoscopy: No FHX, routine screen at 43 Mammogram: No fhx, routine screen at 55, Has OB/GYN Cervical cancer screening:2019,  Dr. Melba Coon Immunizations: FluUTD through [facility].Tdap UTD2015, COVID series completed Infectious disease screening: HIV screen completed, Negative DEXA: routine screen Assistive device: none Oxygen UTM:LYYT Patient has a Dental home. Hospitalizations/ED visits: reviewed  Depression screen [facility] 2/9 03/12/2020 05/24/2019 03/09/2019 03/01/2018 02/16/2018  Decreased Interest 0 0 0 0 0  Down, Depressed, Hopeless 0 0 0 0 0  PHQ - 2 Score 0 0 0 0 0  Altered sleeping - 0 0 - -  Tired, decreased energy - 1 2 - -  Change in appetite - 0 0 - -  Feeling bad or failure about yourself  - 0 0 - -  Trouble concentrating - 0 0 - -  Moving slowly or fidgety/restless - 0 0 - -  Suicidal thoughts - 0 0 - -    PHQ-9 Score - 1 2 - -  Difficult doing work/chores - Not difficult at all Not difficult at all - -   GAD 7 : Generalized Anxiety Score 03/12/2020 05/24/2019 03/09/2019  Nervous, Anxious, on Edge 0 1 2  Control/stop worrying 0 0 2  Worry too much - different things 0 0 2  Trouble relaxing 0 0 2  Restless 0 0 0  Easily annoyed or irritable 0 0 1  Afraid - awful might happen 0 0 2  Total GAD 7 Score 0 1 11  Anxiety Difficulty Not difficult at all Not difficult at all Not difficult at all     Immunization History  Administered Date(s) Administered  . Influenza-Unspecified 04/21/2015, 06/20/2017, 06/19/2018  . MMR 04/20/2014  . PFIZER SARS-COV-2 Vaccination 11/16/2019, 12/07/2019  . Tdap 12/19/2013, 01/29/2014    Past Medical History:  Diagnosis Date  . Anemia    after delivery  . Back skin lesion 09/05/2015  . Cholestasis during pregnancy   . Diastasis recti 03/01/2018  . Hemorrhagic cyst of ovary 07/10/2018  . Lesion of vulva 06/05/2018  . Medical history non-contributory   . Normal pregnancy 04/18/2014  . SVD (spontaneous vaginal delivery) 04/19/2014  . SVD (spontaneous vaginal delivery) 12/28/2016   No Known Allergies Past Surgical History:  Procedure Laterality Date  . BREAST SURGERY Left    cyst aspiration  . WISDOM TOOTH EXTRACTION     Family History  Problem Relation Age of Onset  . Heart disease Father   . Hashimoto's  thyroiditis Sister   . Heart disease Maternal Grandfather    Social History   Social History Narrative   Married. Husband's names Darnelle Maffucci. One child (female) [name].   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 Takes a daily vitamin.   Wears her seatbelt. Smoke detector located in the home. Firearms located in the home and a locked.   Feels safe in her relationships.     Allergies as of 03/12/2020   No Known Allergies     Medication List       Accurate as of March 12, 2020 10:24 AM. If you have any questions, ask  your nurse or doctor.        STOP taking these medications   fluticasone 50 MCG/ACT nasal spray Commonly known as: FLONASE Stopped by: [name], DO     TAKE these medications   CULTURELLE PROBIOTICS PO Take by mouth.   PROBIOTIC-10 PO   escitalopram 10 MG tablet Commonly known as: LEXAPRO Take 1 tablet (10 mg total) by mouth daily.   FLONASE SENSIMIST NA   ibuprofen 600 MG tablet Commonly known as: ADVIL Take 1 tablet (600 mg total) by mouth every 6 (six) hours as needed.   Mucinex 600 MG 12 hr tablet Generic drug: guaiFENesin Take 2 tablets (1,200 mg total) by mouth 2 (two) times daily as needed.   Vitamin D (Cholecalciferol) 10 MCG (400 UNIT) Caps Take 1 tablet by mouth as needed. 2,000 daily   Cholecalciferol 1.25 MG (50000 UT) capsule cholecalciferol (vitamin D3) 1,250 mcg (50,000 unit) capsule  TK 1 C PO Q WK       All past medical history, surgical history, allergies, family history, immunizations andmedications were updated in the EMR today and reviewed under the history and medication portions of their EMR.     No results found for this or any previous visit (from the past 2160 hour(s)).  No results found.   ROS: 14 pt review of systems performed and negative (unless mentioned in an HPI)  Objective: BP 116/73 (BP Location: Left Arm, Patient Position: Sitting, Cuff Size: Normal)   Pulse 82   Temp 98.6 F (37 C) (Temporal)   Resp 18   Ht 5' 2.5" (1.588 m)   Wt 129 lb 4 oz (58.6 kg)   LMP 02/27/2020 (Exact Date)   SpO2 100%   BMI 23.26 kg/m  Gen: Afebrile. No acute distress. Nontoxic in appearance, well-developed, well-nourished,  Pleasant female.  HENT: AT. [place]. Bilateral TM visualized and normal in appearance, normal external auditory canal. MMM, no oral lesions, adequate dentition. Bilateral nares within normal limits. Throat without erythema, ulcerations or exudates. no Cough on exam, no hoarseness on exam. Eyes:Pupils Equal Round Reactive to  light, Extraocular movements intact,  Conjunctiva without redness, discharge or icterus. Neck/lymp/endocrine: Supple,no lymphadenopathy, no thyromegaly CV: RRR no murmur, no edema, +2/4 P posterior tibialis pulses. No carotid bruits. No JVD. Chest: CTAB, no wheeze, rhonchi or crackles. normal Respiratory effort. good Air movement. Abd: Soft. flat. NTND. BS present. no Masses palpated. No hepatosplenomegaly. No rebound tenderness or guarding. Skin: no rashes, purpura or petechiae. Warm and well-perfused. Skin intact. Neuro/Msk:  Normal gait. PERLA. EOMi. Alert. Oriented x3.  Cranial nerves II through XII intact. Muscle strength 5/5 upper/lower extremity. DTRs equal bilaterally. Psych: Normal affect, dress and demeanor. Normal speech. Normal thought content and judgment.  No exam data present  Assessment/plan: AGRIPINA GUYETTE is a 29 y.o. female present for CPE  Worries Doing well on lexapro. Would like to continue medication.  - TSH - escitalopram (LEXAPRO) 10 MG tablet; Take 1 tablet (10 mg total) by mouth daily.  Dispense: 90 tablet; Refill: 1 - next appt 5.5 mos.  Encounter for long-term (current) use of medications - Comprehensive metabolic panel Screening for deficiency anemia - CBC Lipid screening - Lipid panel Diabetes mellitus screening - Hemoglobin A1c Encounter for preventive health examination Patient was encouraged to exercise greater than 150 minutes a week. Patient was encouraged to choose a diet filled with fresh fruits and vegetables, and lean meats. AVS provided to patient today for education/recommendation on gender specific health and safety maintenance. Colonoscopy: No FHX, routine screen at 43 Mammogram: No fhx, routine screen at 50, Has OB/GYN Cervical cancer screening:2019,  Dr. Melba Coon Immunizations: FluUTD through [facility].Tdap UTD2015, COVID series completed Infectious disease screening: HIV screen completed, Negative DEXA: routine screen  Return in about 6  months (around 08/28/2020) for [facility] and 1 yr for CPE.   Orders Placed This Encounter  Procedures  . CBC  . Comprehensive metabolic panel  . Hemoglobin A1c  . Lipid panel  . TSH   Meds ordered this encounter  Medications  . escitalopram (LEXAPRO) 10 MG tablet    Sig: Take 1 tablet (10 mg total) by mouth daily.    Dispense:  90 tablet    Refill:  1   Referral Orders  No referral(s) requested today     Electronically signed by: [name], [place]

## 2020-06-26 IMAGING — US US ART/VEN ABD/PELV/SCROTUM DOPPLER LTD
1 series · 13 of 25 positions shown · non-contrast
Comparison: Outside pelvic ultrasound examination May 04, 2018

CLINICAL DATA: Sudden onset of lower abdominal and pelvic pain for
the past day

EXAM:
TRANSABDOMINAL AND TRANSVAGINAL ULTRASOUND OF PELVIS
DOPPLER ULTRASOUND OF OVARIES
TECHNIQUE: Both transabdominal and transvaginal ultrasound examinations of the
pelvis were performed. Transabdominal technique was performed for
global imaging of the pelvis including uterus, ovaries, adnexal
regions, and pelvic cul-de-sac.
It was necessary to proceed with endovaginal exam following the
transabdominal exam to visualize the uterus, endometrium, ovaries,
and adnexal structures.. Color and duplex Doppler ultrasound was
utilized to evaluate blood flow to the ovaries.

[Series 1: us art/ven abd/pelv/scrotum doppler ltd · 0.20mm/px · 55 acquisitions, 13 frames shown]
[im 1/55]
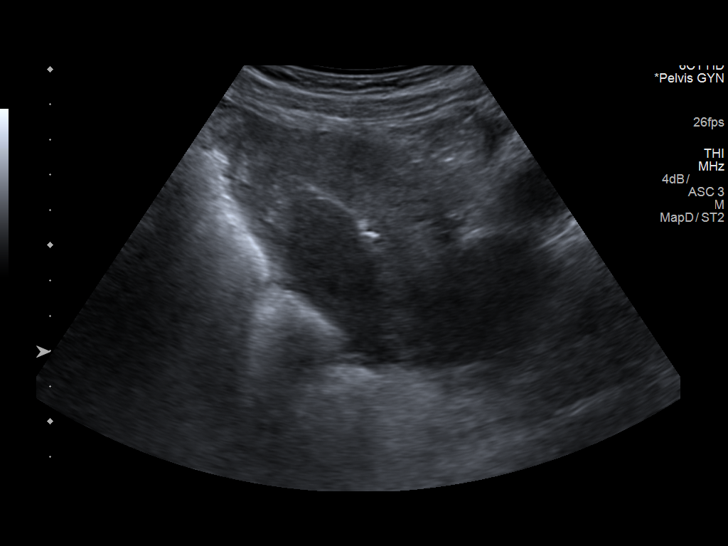
[im 5/55]
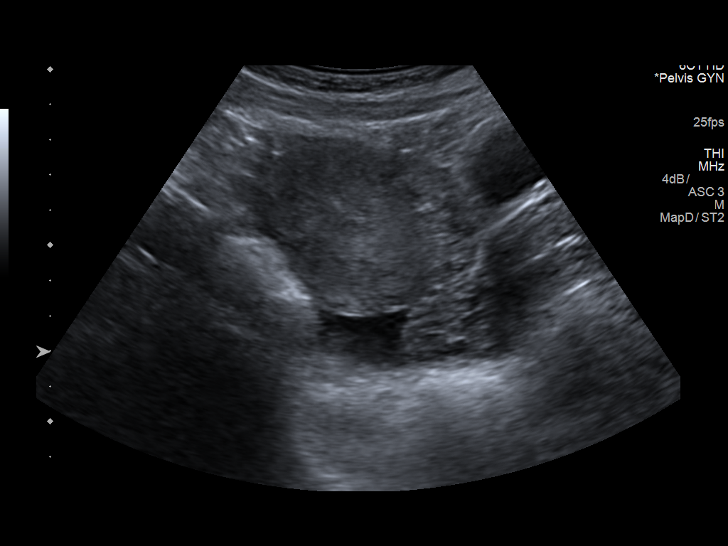
[im 10/55]
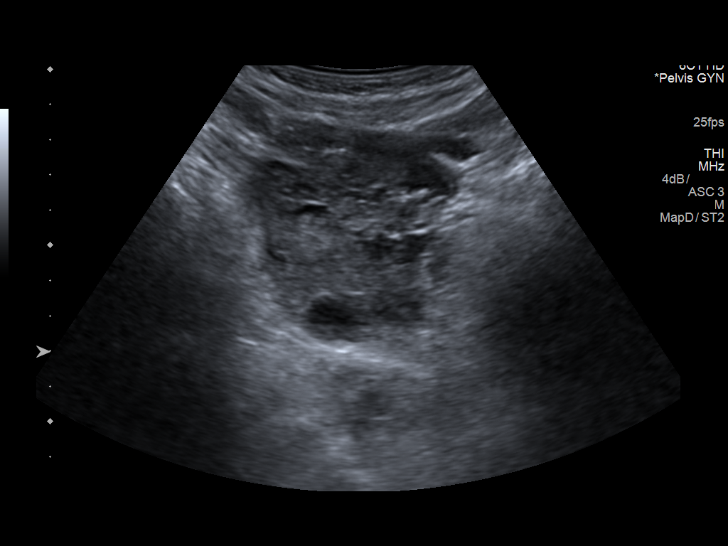
[im 14/55]
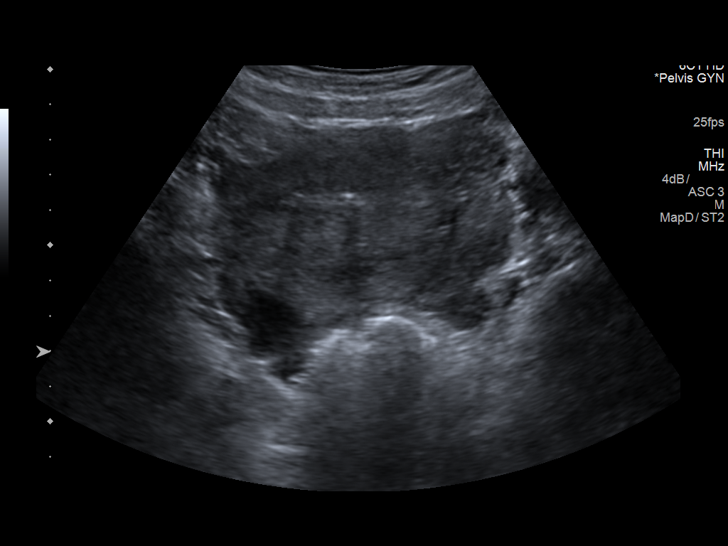
[im 19/55]
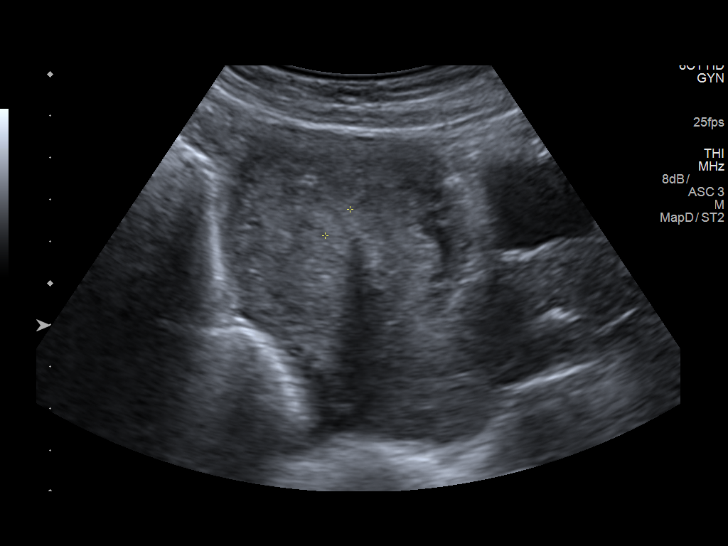
[im 23/55]
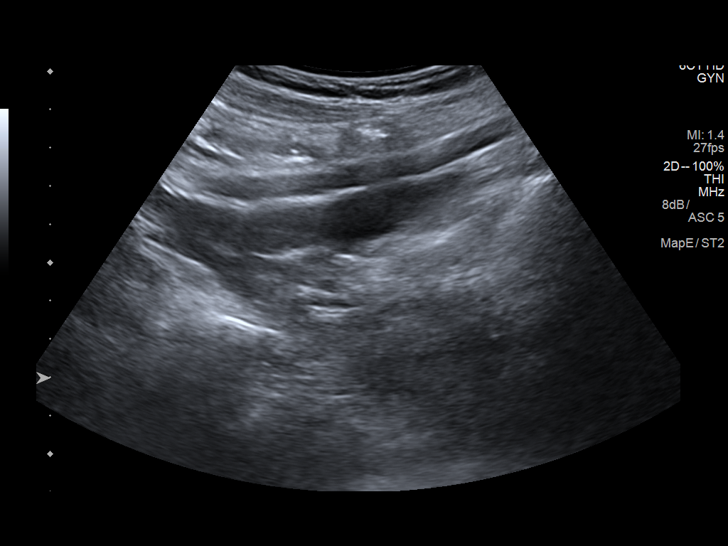
[im 28/55]
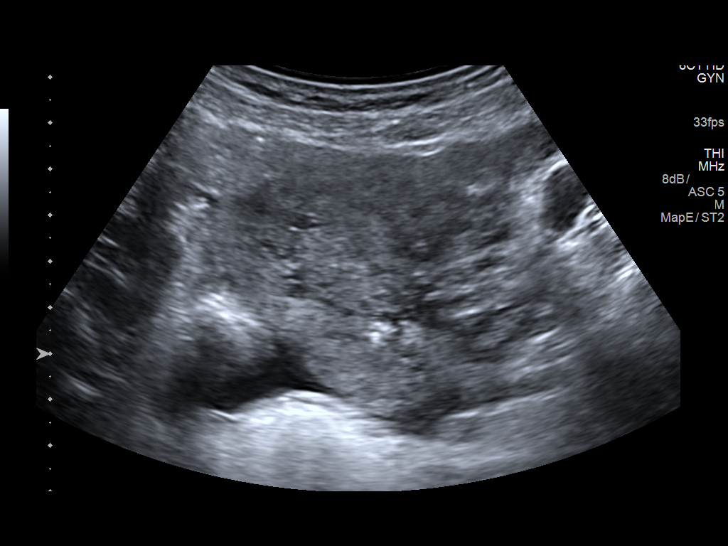
[im 32/55]
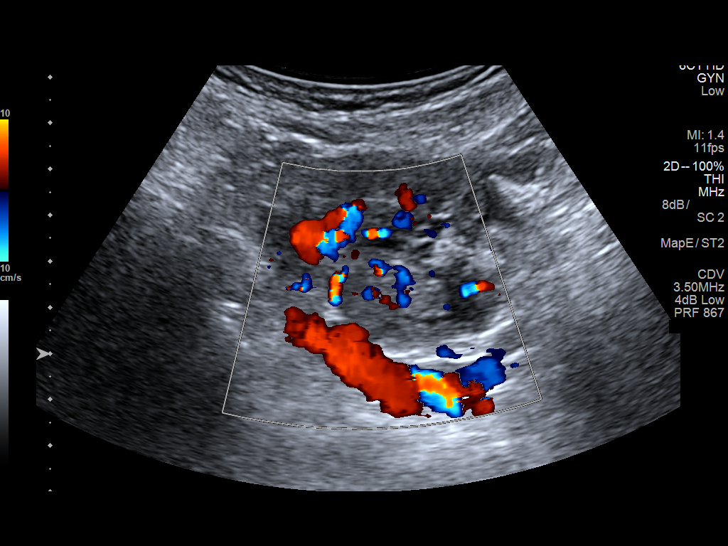
[im 37/55]
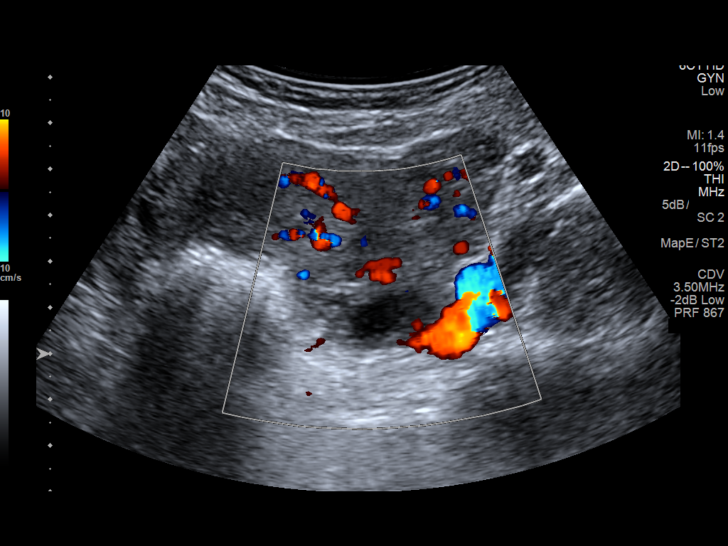
[im 41/55]
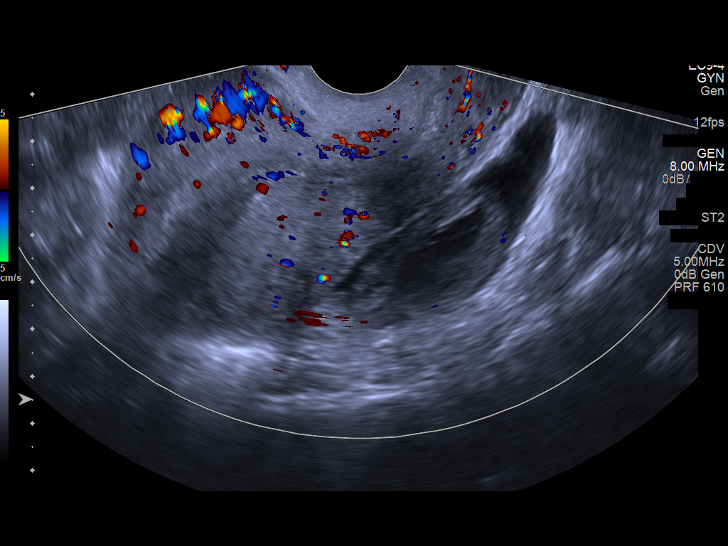
[im 46/55]
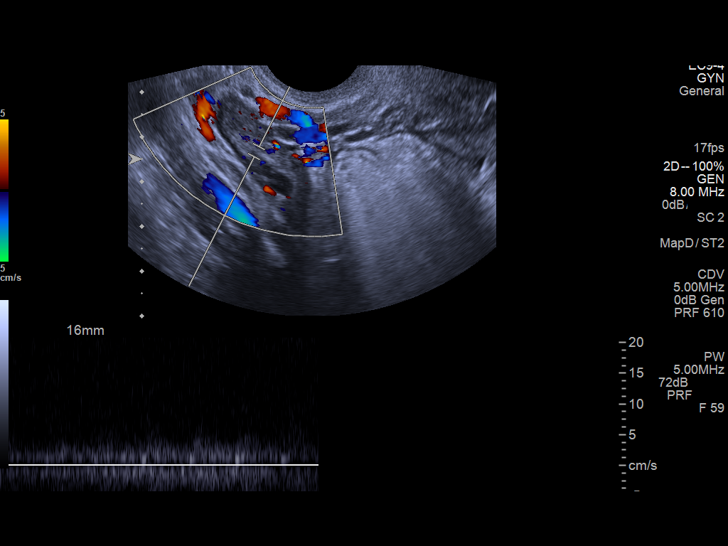
[im 50/55]
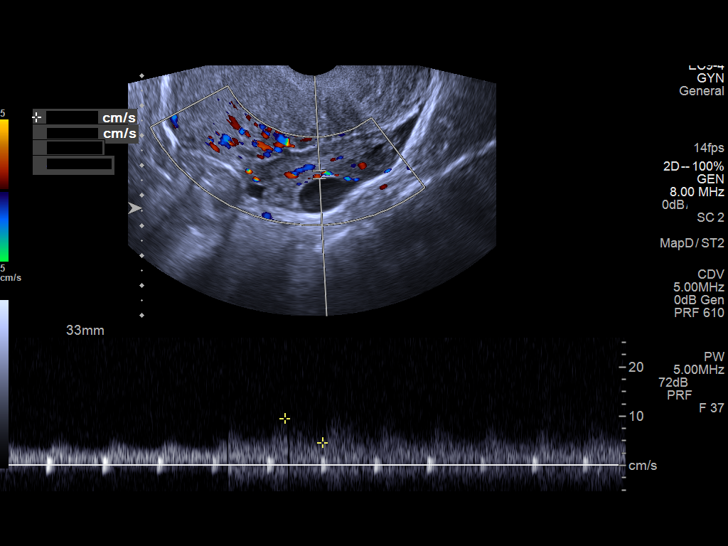
[im 55/55]
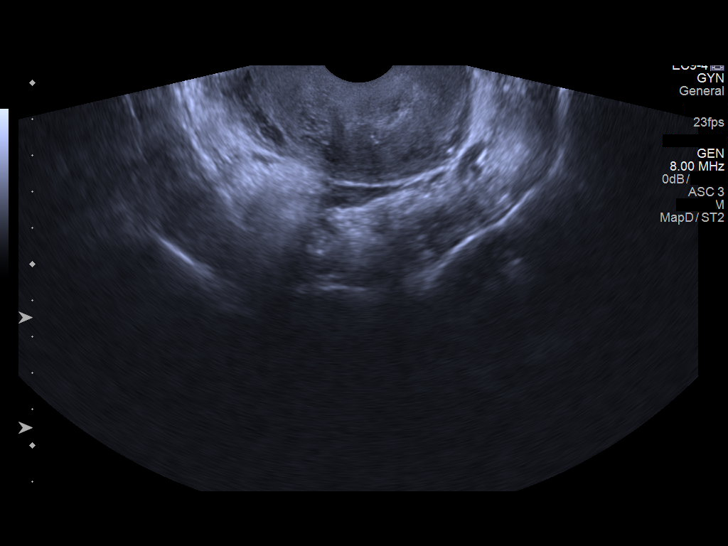

[13 of 25 positions shown; findings below may reference images not displayed]

FINDINGS: Uterus

Measurements: 8.9 x 4.9 x 7.0 cm. No fibroids or other mass
visualized.

Endometrium

Thickness: 8.7 mm.  No focal abnormality visualized.

Right ovary

Measurements: 3.0 x 1.2 x 2.1 cm. Normal appearance/no adnexal mass.
Developing follicles are present.

Left ovary

Measurements: 5.6 x 2.3 x 4.4 cm.. There are cystic regions
demonstrated within the left ovary exhibiting a few internal echoes.
One measures 1.7 x 1.1 x 1.7 cm in the second measures 3.2 x 2.1 x
0.5 cm.

Pulsed Doppler evaluation of both ovaries demonstrates normal
low-resistance arterial and venous waveforms.

Other findings

Small amount of free pelvic fluid.
IMPRESSION: Normal appearance of the uterus. An echogenic focus present in the
endometrial cavity is consistent with the IUD. Normal endometrial
stripe thickness.

Normal vascularity of both ovaries.

Cysts in the left ovary. The larger of these contains some internal
echoes and has a thickened wall. This may reflect a hemorrhagic
cyst. Follow-up ultrasound in 8-12 weeks is recommended.

## 2020-10-27 ENCOUNTER — Other Ambulatory Visit: Payer: Self-pay

## 2020-10-27 ENCOUNTER — Encounter: Payer: Self-pay | Admitting: Family Medicine

## 2020-10-27 ENCOUNTER — Ambulatory Visit (INDEPENDENT_AMBULATORY_CARE_PROVIDER_SITE_OTHER)

## 2020-10-27 ENCOUNTER — Ambulatory Visit (INDEPENDENT_AMBULATORY_CARE_PROVIDER_SITE_OTHER): Payer: PRIVATE HEALTH INSURANCE | Admitting: Family Medicine

## 2020-10-27 DIAGNOSIS — M25522 Pain in left elbow: Secondary | ICD-10-CM | POA: Diagnosis not present

## 2020-10-27 DIAGNOSIS — R202 Paresthesia of skin: Secondary | ICD-10-CM

## 2020-10-27 DIAGNOSIS — R200 Anesthesia of skin: Secondary | ICD-10-CM

## 2020-10-27 NOTE — Progress Notes (Signed)
Office Visit Note   Patient: Shari [name]           Date of Birth: 06-25-1991           MRN: [id number] Visit Date: 10/27/2020 Requested by: Natalia Leatherwood, DO [address] [place],  [place] [address] PCP: Natalia Leatherwood, DO  Subjective: Chief Complaint  Patient presents with  . Left Elbow - Pain    Slipped and fell on the ice this morning, landing on her left side. Soreness in the arm since then, with swelling in the fingers of the left hand. Hurts to bend the elbow. Took some ibuprofen this morning. Tried ice, but it made the arm burn.    HPI: She is here with left elbow pain.  She is right-hand dominant.  This morning she slipped on ice and landed directly on her left side.  Immediate pain in her elbow with numbness and tingling into her fourth and fifth fingers.  She got up and put some ice on her elbow.  It hurts to try to flex it beyond about 90 degrees.  She had swelling in her fingers soon after the fall, she had trouble removing her ring.  No previous problems with her elbow.  She works as a Engineer, civil (consulting) at [employer].  She is otherwise in good health.                ROS:   All other systems were reviewed and are negative.  Objective: Vital Signs: There were no vitals taken for this visit.  Physical Exam:  General:  Alert and oriented, in no acute distress. Pulm:  Breathing unlabored. Psy:  Normal mood, congruent affect. Skin: There is no break in the skin but she does have redness near the proximal ulna/olecranon due to abrasion.  There is some bruising on the medial side of the olecranon and she is exquisitely tender to palpation of the ulnar nerve.  There is also moderate bony tenderness over the olecranon.  She is able to flex and extend her wrist and fingers, she has good intrinsic strength and doing this does not cause significant pain.  There is no tenderness to palpation around the hand.   Imaging: XR Elbow 2 Views Left  Result Date: 10/27/2020 X-rays of the  left elbow reveal anatomic alignment with no obvious fracture.   Assessment & Plan: 1.  Status post fall with left elbow contusion, probable ulnar nerve contusion. -Arm sling, range of motion to tolerance.  Minimize use of arm for the next week.  Anticipate full range of motion over the next 2 weeks.  If unable to fully flex at that point, she will come back in for recheck and repeat x-rays. -Regarding nerve recovery, it could take 3 to 12 weeks.  She will notify me if symptoms persist.      Procedures: No procedures performed        PMFS History: Patient Active Problem List   Diagnosis Date Noted  . Worries 02/16/2018  . Encounter for preventive health examination 08/25/2015   Past Medical History:  Diagnosis Date  . Anemia    after delivery  . Back skin lesion 09/05/2015  . Cholestasis during pregnancy   . Diastasis recti 03/01/2018  . Hemorrhagic cyst of ovary 07/10/2018  . Lesion of vulva 06/05/2018  . Medical history non-contributory   . Normal pregnancy 04/18/2014  . SVD (spontaneous vaginal delivery) 04/19/2014  . SVD (spontaneous vaginal delivery) 12/28/2016  Family History  Problem Relation Age of Onset  . Heart disease Father   . Hashimoto's thyroiditis Sister   . Heart disease Maternal Grandfather     Past Surgical History:  Procedure Laterality Date  . BREAST SURGERY Left    cyst aspiration  . WISDOM TOOTH EXTRACTION     Social History   Occupational History  . Not on file  Tobacco Use  . Smoking status: Never Smoker  . Smokeless tobacco: Never Used  Vaping Use  . Vaping Use: Never used  Substance and Sexual Activity  . Alcohol use: No  . Drug use: No  . Sexual activity: Yes    Birth control/protection: None

## 2021-08-21 LAB — RESULTS CONSOLE HPV: CHL HPV: NEGATIVE

## 2021-08-21 LAB — HM PAP SMEAR

## 2021-09-17 ENCOUNTER — Encounter: Payer: Self-pay | Admitting: Family Medicine

## 2021-09-17 ENCOUNTER — Other Ambulatory Visit: Payer: Self-pay

## 2021-09-17 ENCOUNTER — Ambulatory Visit (INDEPENDENT_AMBULATORY_CARE_PROVIDER_SITE_OTHER): Admitting: Family Medicine

## 2021-09-17 VITALS — BP 93/61 | HR 79 | Temp 98.4°F | Ht 63.0 in | Wt 144.0 lb

## 2021-09-17 DIAGNOSIS — N926 Irregular menstruation, unspecified: Secondary | ICD-10-CM

## 2021-09-17 DIAGNOSIS — Z13220 Encounter for screening for lipoid disorders: Secondary | ICD-10-CM

## 2021-09-17 DIAGNOSIS — Z130 Encounter for screening for diseases of the blood and blood-forming organs and certain disorders involving the immune mechanism: Secondary | ICD-10-CM

## 2021-09-17 DIAGNOSIS — Z131 Encounter for screening for diabetes mellitus: Secondary | ICD-10-CM

## 2021-09-17 DIAGNOSIS — R4582 Worries: Secondary | ICD-10-CM | POA: Diagnosis not present

## 2021-09-17 DIAGNOSIS — Z1159 Encounter for screening for other viral diseases: Secondary | ICD-10-CM

## 2021-09-17 DIAGNOSIS — Z79899 Other long term (current) drug therapy: Secondary | ICD-10-CM | POA: Diagnosis not present

## 2021-09-17 DIAGNOSIS — Z0000 Encounter for general adult medical examination without abnormal findings: Secondary | ICD-10-CM

## 2021-09-17 DIAGNOSIS — E569 Vitamin deficiency, unspecified: Secondary | ICD-10-CM | POA: Diagnosis not present

## 2021-09-17 LAB — VITAMIN D 25 HYDROXY (VIT D DEFICIENCY, FRACTURES): VITD: 30.62 ng/mL (ref 30.00–100.00)

## 2021-09-17 LAB — LIPID PANEL
Cholesterol: 192 mg/dL (ref 0–200)
HDL: 68 mg/dL (ref >39.00)
LDL Cholesterol: 110 mg/dL — ABNORMAL HIGH (ref 0–99)
NonHDL: 123.65
Total CHOL/HDL Ratio: 3
Triglycerides: 68 mg/dL (ref 0.0–149.0)
VLDL: 13.6 mg/dL (ref 0.0–40.0)

## 2021-09-17 LAB — COMPREHENSIVE METABOLIC PANEL
ALT: 14 U/L (ref 0–35)
AST: 17 U/L (ref 0–37)
Albumin: 4.3 g/dL (ref 3.5–5.2)
Alkaline Phosphatase: 16 U/L — ABNORMAL LOW (ref 39–117)
BUN: 11 mg/dL (ref 6–23)
CO2: 25 mEq/L (ref 19–32)
Calcium: 9 mg/dL (ref 8.4–10.5)
Chloride: 104 mEq/L (ref 96–112)
Creatinine, Ser: 0.81 mg/dL (ref 0.40–1.20)
GFR: 95.73 mL/min (ref >60.00)
Glucose, Bld: 86 mg/dL (ref 70–99)
Potassium: 3.9 mEq/L (ref 3.5–5.1)
Sodium: 137 mEq/L (ref 135–145)
Total Bilirubin: 0.6 mg/dL (ref 0.2–1.2)
Total Protein: 7.6 g/dL (ref 6.0–8.3)

## 2021-09-17 LAB — CBC
HCT: 38.9 % (ref 36.0–46.0)
Hemoglobin: 13.1 g/dL (ref 12.0–15.0)
MCHC: 33.6 g/dL (ref 30.0–36.0)
MCV: 90.3 fl (ref 78.0–100.0)
Platelets: 307 10*3/uL (ref 150.0–400.0)
RBC: 4.3 Mil/uL (ref 3.87–5.11)
RDW: 12.5 % (ref 11.5–15.5)
WBC: 4.7 10*3/uL (ref 4.0–10.5)

## 2021-09-17 LAB — HEMOGLOBIN A1C: Hgb A1c MFr Bld: 5.3 % (ref 4.6–6.5)

## 2021-09-17 LAB — TSH: TSH: 1.09 u[IU]/mL (ref 0.35–5.50)

## 2021-09-17 NOTE — Patient Instructions (Signed)
°Great to see you today.  °I have refilled the medication(s) we provide.  ° °If labs were collected, we will inform you of lab results once received either by echart message or telephone call.  ° - echart message- for normal results that have been seen by the patient already.  ° - telephone call: abnormal results or if patient has not viewed results in their echart. ° °Health Maintenance, Female °Adopting a healthy lifestyle and getting preventive care are important in promoting health and wellness. Ask your health care provider about: °The right schedule for you to have regular tests and exams. °Things you can do on your own to prevent diseases and keep yourself healthy. °What should I know about diet, weight, and exercise? °Eat a healthy diet ° °Eat a diet that includes plenty of vegetables, fruits, low-fat dairy products, and lean protein. °Do not eat a lot of foods that are high in solid fats, added sugars, or sodium. °Maintain a healthy weight °Body mass index (BMI) is used to identify weight problems. It estimates body fat based on height and weight. Your health care provider can help determine your BMI and help you achieve or maintain a healthy weight. °Get regular exercise °Get regular exercise. This is one of the most important things you can do for your health. Most adults should: °Exercise for at least 150 minutes each week. The exercise should increase your heart rate and make you sweat (moderate-intensity exercise). °Do strengthening exercises at least twice a week. This is in addition to the moderate-intensity exercise. °Spend less time sitting. Even light physical activity can be beneficial. °Watch cholesterol and blood lipids °Have your blood tested for lipids and cholesterol at 32 years of age, then have this test every 5 years. °Have your cholesterol levels checked more often if: °Your lipid or cholesterol levels are high. °You are older than 32 years of age. °You are at high risk for heart  disease. °What should I know about cancer screening? °Depending on your health history and family history, you may need to have cancer screening at various ages. This may include screening for: °Breast cancer. °Cervical cancer. °Colorectal cancer. °Skin cancer. °Lung cancer. °What should I know about heart disease, diabetes, and high blood pressure? °Blood pressure and heart disease °High blood pressure causes heart disease and increases the risk of stroke. This is more likely to develop in people who have high blood pressure readings or are overweight. °Have your blood pressure checked: °Every 3-5 years if you are 18-39 years of age. °Every year if you are 40 years old or older. °Diabetes °Have regular diabetes screenings. This checks your fasting blood sugar level. Have the screening done: °Once every three years after age 40 if you are at a normal weight and have a low risk for diabetes. °More often and at a younger age if you are overweight or have a high risk for diabetes. °What should I know about preventing infection? °Hepatitis B °If you have a higher risk for hepatitis B, you should be screened for this virus. Talk with your health care provider to find out if you are at risk for hepatitis B infection. °Hepatitis C °Testing is recommended for: °Everyone born from 1945 through 1965. °Anyone with known risk factors for hepatitis C. °Sexually transmitted infections (STIs) °Get screened for STIs, including gonorrhea and chlamydia, if: °You are sexually active and are younger than 32 years of age. °You are older than 32 years of age and your health care provider   tells you that you are at risk for this type of infection. °Your sexual activity has changed since you were last screened, and you are at increased risk for chlamydia or gonorrhea. Ask your health care provider if you are at risk. °Ask your health care provider about whether you are at high risk for HIV. Your health care provider may recommend a  prescription medicine to help prevent HIV infection. If you choose to take medicine to prevent HIV, you should first get tested for HIV. You should then be tested every 3 months for as long as you are taking the medicine. °Pregnancy °If you are about to stop having your period (premenopausal) and you may become pregnant, seek counseling before you get pregnant. °Take 400 to 800 micrograms (mcg) of folic acid every day if you become pregnant. °Ask for birth control (contraception) if you want to prevent pregnancy. °Osteoporosis and menopause °Osteoporosis is a disease in which the bones lose minerals and strength with aging. This can result in bone fractures. If you are 65 years old or older, or if you are at risk for osteoporosis and fractures, ask your health care provider if you should: °Be screened for bone loss. °Take a calcium or vitamin D supplement to lower your risk of fractures. °Be given hormone replacement therapy (HRT) to treat symptoms of menopause. °Follow these instructions at home: °Alcohol use °Do not drink alcohol if: °Your health care provider tells you not to drink. °You are pregnant, may be pregnant, or are planning to become pregnant. °If you drink alcohol: °Limit how much you have to: °0-1 drink a day. °Know how much alcohol is in your drink. In the [place] one drink equals one 12 oz bottle of beer (355 mL), one 5 oz glass of wine (148 mL), or one 1½ oz glass of hard liquor (44 mL). °Lifestyle °Do not use any products that contain nicotine or tobacco. These products include cigarettes, chewing tobacco, and vaping devices, such as e-cigarettes. If you need help quitting, ask your health care provider. °Do not use street drugs. °Do not share needles. °Ask your health care provider for help if you need support or information about quitting drugs. °General instructions °Schedule regular health, dental, and eye exams. °Stay current with your vaccines. °Tell your health care provider if: °You often  feel depressed. °You have ever been abused or do not feel safe at home. °Summary °Adopting a healthy lifestyle and getting preventive care are important in promoting health and wellness. °Follow your health care provider's instructions about healthy diet, exercising, and getting tested or screened for diseases. °Follow your health care provider's instructions on monitoring your cholesterol and blood pressure. °This information is not intended to replace advice given to you by your health care provider. Make sure you discuss any questions you have with your health care provider. °Document Revised: 01/26/2021 Document Reviewed: 01/26/2021 °Elsevier Patient Education © 2022 [employer] ° °

## 2021-09-17 NOTE — Progress Notes (Signed)
This visit occurred during the SARS-CoV-2 public health emergency.  Safety protocols were in place, including screening questions prior to the visit, additional usage of staff PPE, and extensive cleaning of exam room while observing appropriate contact time as indicated for disinfecting solutions.    Patient ID: Shari [name], female  DOB: 01/06/1991, 30 y.o.   MRN: [id number] Patient Care Team    Relationship Specialty Notifications Start End  Ma Hillock, DO PCP - General Family Medicine  08/22/15   [name], MD Consulting Physician Obstetrics and Gynecology  03/01/18   Specialists, Dermatology  Dermatology  03/01/18     Chief Complaint  Patient presents with   Annual Exam    Pt is fasting    Subjective: MELISS [name] is a 30 y.o.  Female  present for CPE All past medical history, surgical history, allergies, family history, immunizations, medications and social history were updated in the electronic medical record today. All recent labs, ED visits and hospitalizations within the last year were reviewed.  Health maintenance:  Colonoscopy: No FHX, routine screen at 43 Mammogram: No fhx, routine screen at 85, Has OB/GYN Cervical cancer screening:2022,  Dr. Melba Coon Immunizations: Flu UTD 06/2021. Tdap UTD 2015, COVID series completed Infectious disease screening: HIV screen completed, Negative. Hep C collected today DEXA: routine screen 60-65 Assistive device: none Oxygen OIB:BCWU Patient has a Dental home. Hospitalizations/ED visits: reviewed  Depression screen [facility] 2/9 09/17/2021 03/12/2020 05/24/2019 03/09/2019 03/01/2018  Decreased Interest 0 0 0 0 0  Down, Depressed, Hopeless 0 0 0 0 0  PHQ - 2 Score 0 0 0 0 0  Altered sleeping - - 0 0 -  Tired, decreased energy - - 1 2 -  Change in appetite - - 0 0 -  Feeling bad or failure about yourself  - - 0 0 -  Trouble concentrating - - 0 0 -  Moving slowly or fidgety/restless - - 0 0 -  Suicidal thoughts - - 0 0 -   PHQ-9 Score - - 1 2 -  Difficult doing work/chores - - Not difficult at all Not difficult at all -   GAD 7 : Generalized Anxiety Score 03/12/2020 05/24/2019 03/09/2019  Nervous, Anxious, on Edge 0 1 2  Control/stop worrying 0 0 2  Worry too much - different things 0 0 2  Trouble relaxing 0 0 2  Restless 0 0 0  Easily annoyed or irritable 0 0 1  Afraid - awful might happen 0 0 2  Total GAD 7 Score 0 1 11  Anxiety Difficulty Not difficult at all Not difficult at all Not difficult at all    Immunization History  Administered Date(s) Administered   Influenza-Unspecified 04/21/2015, 06/20/2017, 06/19/2018, 06/20/2021   MMR 04/20/2014   PFIZER(Purple Top)SARS-COV-2 Vaccination 11/16/2019, 12/07/2019   Tdap 12/19/2013, 01/29/2014   Past Medical History:  Diagnosis Date   Anemia    after delivery   Back skin lesion 09/05/2015   Cholestasis during pregnancy    Diastasis recti 03/01/2018   Hemorrhagic cyst of ovary 07/10/2018   Lesion of vulva 06/05/2018   Medical history non-contributory    Normal pregnancy 04/18/2014   SVD (spontaneous vaginal delivery) 04/19/2014   SVD (spontaneous vaginal delivery) 12/28/2016   No Known Allergies Past Surgical History:  Procedure Laterality Date   BREAST SURGERY Left    cyst aspiration   WISDOM TOOTH EXTRACTION     Family History  Problem Relation Age of Onset   Diabetes Mother  Heart disease Father    Hashimoto's thyroiditis Sister    Heart disease Maternal Grandfather    Social History   Social History Narrative   Married. Husband's names Shari [name]. One child (female) Shari [name].   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 Takes a daily vitamin.   Wears her seatbelt. Smoke detector located in the home. Firearms located in the home and a locked.   Feels safe in her relationships.     Allergies as of 09/17/2021   No Known Allergies      Medication List        Accurate as of September 17, 2021 10:06 AM.  If you have any questions, ask your nurse or doctor.          STOP taking these medications    escitalopram 10 MG tablet Commonly known as: LEXAPRO Stopped by: [name], DO   ibuprofen 600 MG tablet Commonly known as: ADVIL Stopped by: [name], DO       TAKE these medications    CULTURELLE PROBIOTICS PO Take by mouth.   Vitamin D (Cholecalciferol) 10 MCG (400 UNIT) Caps Take 1 tablet by mouth as needed. 2,000 daily        All past medical history, surgical history, allergies, family history, immunizations andmedications were updated in the EMR today and reviewed under the history and medication portions of their EMR.     No results found for this or any previous visit (from the past 2160 hour(s)).  No results found.  ROS 14 pt review of systems performed and negative (unless mentioned in an HPI)  Objective:  BP 93/61    Pulse 79    Temp 98.4 F (36.9 C) (Oral)    Ht '5\' 3"'  (1.6 m)    Wt 144 lb (65.3 kg)    SpO2 98%    BMI 25.51 kg/m   Physical Exam Vitals and nursing note reviewed.  Constitutional:      General: She is not in acute distress.    Appearance: Normal appearance. She is not ill-appearing or toxic-appearing.  HENT:     Head: Normocephalic and atraumatic.     Right Ear: Tympanic membrane, ear canal and external ear normal. There is no impacted cerumen.     Left Ear: Tympanic membrane, ear canal and external ear normal. There is no impacted cerumen.     Nose: No congestion or rhinorrhea.     Mouth/Throat:     Mouth: Mucous membranes are moist.     Pharynx: Oropharynx is clear. No oropharyngeal exudate or posterior oropharyngeal erythema.  Eyes:     General: No scleral icterus.       Right eye: No discharge.        Left eye: No discharge.     Extraocular Movements: Extraocular movements intact.     Conjunctiva/sclera: Conjunctivae normal.     Pupils: Pupils are equal, round, and reactive to light.  Cardiovascular:     Rate and Rhythm:  Normal rate and regular rhythm.     Pulses: Normal pulses.     Heart sounds: Normal heart sounds. No murmur heard.   No friction rub. No gallop.  Pulmonary:     Effort: Pulmonary effort is normal. No respiratory distress.     Breath sounds: Normal breath sounds. No stridor. No wheezing, rhonchi or rales.  Chest:     Chest wall: No tenderness.  Abdominal:     General: Abdomen is flat. Bowel sounds  are normal. There is no distension.     Palpations: Abdomen is soft. There is no mass.     Tenderness: There is no abdominal tenderness. There is no right CVA tenderness, left CVA tenderness, guarding or rebound.     Hernia: No hernia is present.  Musculoskeletal:        General: No swelling, tenderness or deformity. Normal range of motion.     Cervical back: Normal range of motion and neck supple. No rigidity or tenderness.     Right lower leg: No edema.     Left lower leg: No edema.  Lymphadenopathy:     Cervical: No cervical adenopathy.  Skin:    General: Skin is warm and dry.     Coloration: Skin is not jaundiced or pale.     Findings: No bruising, erythema, lesion or rash.  Neurological:     General: No focal deficit present.     Mental Status: She is alert and oriented to person, place, and time. Mental status is at baseline.     Cranial Nerves: No cranial nerve deficit.     Sensory: No sensory deficit.     Motor: No weakness.     Coordination: Coordination normal.     Gait: Gait normal.     Deep Tendon Reflexes: Reflexes normal.  Psychiatric:        Mood and Affect: Mood normal.        Behavior: Behavior normal.        Thought Content: Thought content normal.        Judgment: Judgment normal.     No results found.  Assessment/plan: KANNON BAUM is a 30 y.o. 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 Melba Coon Immunizations: Flu UTD 06/2021. Tdap UTD 2015, COVID series completed Infectious disease screening: HIV screen completed, Negative. Hep C agreeable to testing.  DEXA: routine screen 60-65 Patient was encouraged to exercise greater than 150 minutes a week. Patient was encouraged to choose a diet filled with fresh fruits and vegetables, and lean meats. AVS provided to patient today for education/recommendation on gender specific health and safety maintenance.  Return in about 1 year (around 09/18/2022) for CPE (30 min).  Orders Placed This Encounter  Procedures   CBC   Comp Met (CMET)   Lipid panel   Hemoglobin A1c   TSH   Vitamin D (25 hydroxy)   Hepatitis C antibody   No orders of the defined types were placed in this encounter.  Referral Orders  No referral(s) requested today    Electronically signed by: [name], [place]

## 2021-09-18 LAB — HEPATITIS C ANTIBODY
Hepatitis C Ab: NONREACTIVE
SIGNAL TO CUT-OFF: 0.05 (ref <1.00)

## 2021-10-27 ENCOUNTER — Other Ambulatory Visit: Payer: Self-pay

## 2021-10-27 ENCOUNTER — Encounter: Payer: Self-pay | Admitting: Family Medicine

## 2021-10-27 ENCOUNTER — Ambulatory Visit (INDEPENDENT_AMBULATORY_CARE_PROVIDER_SITE_OTHER): Admitting: Family Medicine

## 2021-10-27 VITALS — BP 103/68 | HR 82 | Temp 98.4°F | Ht 62.5 in | Wt 145.0 lb

## 2021-10-27 DIAGNOSIS — R519 Headache, unspecified: Secondary | ICD-10-CM

## 2021-10-27 DIAGNOSIS — J029 Acute pharyngitis, unspecified: Secondary | ICD-10-CM | POA: Diagnosis not present

## 2021-10-27 LAB — POC COVID19 BINAXNOW: SARS Coronavirus 2 Ag: NEGATIVE

## 2021-10-27 LAB — POCT RAPID STREP A (OFFICE): Rapid Strep A Screen: NEGATIVE

## 2021-10-27 LAB — POCT INFLUENZA A/B
Influenza A, POC: NEGATIVE
Influenza B, POC: NEGATIVE

## 2021-10-27 MED ORDER — AMOXICILLIN-POT CLAVULANATE 875-125 MG PO TABS: 1.0000 | ORAL_TABLET | Freq: Two times a day (BID) | ORAL | 0 refills | Status: DC

## 2021-10-27 MED ORDER — FLUCONAZOLE 150 MG PO TABS: 150.0000 mg | ORAL_TABLET | Freq: Once | ORAL | 0 refills | Status: AC

## 2021-10-27 NOTE — Patient Instructions (Signed)
Strep Throat, Adult ?Strep throat is an infection of the throat. It is caused by germs (bacteria). Strep throat is common during the cold months of the year. It mostly affects children who are 5-33 years old. However, people of all ages can get it at any time of the year. This infection spreads from person to person through coughing, sneezing, or having close contact. ?What are the causes? ?This condition is caused by the Streptococcus pyogenes germ. ?What increases the risk? ?You care for young children. Children are more likely to get strep throat and may spread it to others. ?You go to crowded places. Germs can spread easily in such places. ?You kiss or touch someone who has strep throat. ?What are the signs or symptoms? ?Fever or chills. ?Redness, swelling, or pain in the tonsils or throat. ?Pain or trouble when swallowing. ?White or yellow spots on the tonsils or throat. ?Tender glands in the neck and under the jaw. ?Bad breath. ?Red rash all over the body. This is rare. ?How is this treated? ?Medicines that kill germs (antibiotics). ?Medicines that treat pain or fever. These include: ?Ibuprofen or acetaminophen. ?Aspirin, only for people who are over the age of 18. ?Cough drops. ?Throat sprays. ?Follow these instructions at home: ?Medicines ? ?Take over-the-counter and prescription medicines only as told by your doctor. ?Take your antibiotic medicine as told by your doctor. Do not stop taking the antibiotic even if you start to feel better. ?Eating and drinking ? ?If you have trouble swallowing, eat soft foods until your throat feels better. ?Drink enough fluid to keep your pee (urine) pale yellow. ?To help with pain, you may have: ?Warm fluids, such as soup and tea. ?Cold fluids, such as frozen desserts or popsicles. ?General instructions ?Rinse your mouth (gargle) with a salt-water mixture 3-4 times a day or as needed. To make a salt-water mixture, dissolve ?-1 tsp (3-6 g) of salt in 1 cup (237 mL) of warm  water. ?Rest as much as you can. ?Stay home from work or school until you have been taking antibiotics for 24 hours. ?Do not smoke or use any products that contain nicotine or tobacco. If you need help quitting, ask your doctor. ?Keep all follow-up visits. ?How is this prevented? ? ?Do not share food, drinking cups, or personal items. They can cause the germs to spread. ?Wash your hands well with soap and water. Make sure that all people in your house wash their hands well. ?Have family members tested if they have a fever or a sore throat. They may need an antibiotic if they have strep throat. ?Contact a doctor if: ?You have swelling in your neck that keeps getting bigger. ?You get a rash, cough, or earache. ?You cough up a thick fluid that is green, yellow-brown, or bloody. ?You have pain that does not get better with medicine. ?Your symptoms get worse instead of getting better. ?You have a fever. ?Get help right away if: ?You vomit. ?You have a very bad headache. ?Your neck hurts or feels stiff. ?You have chest pain or are short of breath. ?You have drooling, very bad throat pain, or changes in your voice. ?Your neck is swollen, or the skin gets red and tender. ?Your mouth is dry, or you are peeing less than normal. ?You keep feeling more tired or have trouble waking up. ?Your joints are red or painful. ?These symptoms may be an emergency. Do not wait to see if the symptoms will go away. Get help right away. Call   your local emergency services (911 in the [place] ?Summary ?Strep throat is an infection of the throat. It is caused by germs (bacteria). ?This infection can spread from person to person through coughing, sneezing, or having close contact. ?Take your medicines, including antibiotics, as told by your doctor. Do not stop taking the antibiotic even if you start to feel better. ?To prevent the spread of germs, wash your hands well with soap and water. Have others do the same. Do not share food, drinking cups,  or personal items. ?Get help right away if you have a bad headache, chest pain, shortness of breath, a stiff or painful neck, or you vomit. ?This information is not intended to replace advice given to you by your health care provider. Make sure you discuss any questions you have with your health care provider. ?Document Revised: 12/30/2020 Document Reviewed: 12/30/2020 ?Elsevier Patient Education ? 2022 Elsevier Inc. ? ?

## 2021-10-27 NOTE — Progress Notes (Signed)
This visit occurred during the SARS-CoV-2 public health emergency.  Safety protocols were in place, including screening questions prior to the visit, additional usage of staff PPE, and extensive cleaning of exam room while observing appropriate contact time as indicated for disinfecting solutions.    Shari [name] , 1991/05/05, 31 y.o., female MRN: [id number] Patient Care Team    Relationship Specialty Notifications Start End  Natalia Leatherwood, DO PCP - General Family Medicine  08/22/15   Sherian Rein, MD Consulting Physician Obstetrics and Gynecology  03/01/18   Specialists, Dermatology  Dermatology  03/01/18     Chief Complaint  Patient presents with   Sore Throat    Pt c/o sore throat, HA, fatigue x 1 day; pt daughter pos for strep today;      Subjective: Pt presents for an OV with complaints of sore throat, headache that started early yesterday.  She is feeling extremely tired today.  She also endorses right ear pain and swollen glands.  She denies objective fever, but has felt feverish.  She denies chills, cough or shortness of breath.  She has been taking over-the-counter supportive care meds to help with symptoms.  Her daughter had similar symptoms and tested positive for strep yesterday at her pediatrician.  Depression screen [facility] 2/9 09/17/2021 03/12/2020 05/24/2019 03/09/2019 03/01/2018  Decreased Interest 0 0 0 0 0  Down, Depressed, Hopeless 0 0 0 0 0  PHQ - 2 Score 0 0 0 0 0  Altered sleeping - - 0 0 -  Tired, decreased energy - - 1 2 -  Change in appetite - - 0 0 -  Feeling bad or failure about yourself  - - 0 0 -  Trouble concentrating - - 0 0 -  Moving slowly or fidgety/restless - - 0 0 -  Suicidal thoughts - - 0 0 -  PHQ-9 Score - - 1 2 -  Difficult doing work/chores - - Not difficult at all Not difficult at all -    No Known Allergies Social History   Social History Narrative   Married. Husband's names Shari [name]. One child (female) Shari [name].   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    Drinks caffeinated beverages. Takes a daily vitamin.   Wears her seatbelt. Smoke detector located in the home. Firearms located in the home and a locked.   Feels safe in her relationships.    Past Medical History:  Diagnosis Date   Anemia    after delivery   Back skin lesion 09/05/2015   Cholestasis during pregnancy    Diastasis recti 03/01/2018   Hemorrhagic cyst of ovary 07/10/2018   Lesion of vulva 06/05/2018   Medical history non-contributory    Normal pregnancy 04/18/2014   SVD (spontaneous vaginal delivery) 04/19/2014   SVD (spontaneous vaginal delivery) 12/28/2016   Past Surgical History:  Procedure Laterality Date   BREAST SURGERY Left    cyst aspiration   WISDOM TOOTH EXTRACTION     Family History  Problem Relation Age of Onset   Diabetes Mother    Heart disease Father    Hashimoto's thyroiditis Sister    Heart disease Maternal Grandfather    Allergies as of 10/27/2021   No Known Allergies      Medication List        Accurate as of October 27, 2021 11:59 PM. If you have any questions, ask your nurse or doctor.          amoxicillin-clavulanate 875-125 MG tablet  Commonly known as: AUGMENTIN Take 1 tablet by mouth 2 (two) times daily. Started by: Felix Pacini, DO   CULTURELLE PROBIOTICS PO Take by mouth.   escitalopram 10 MG tablet Commonly known as: LEXAPRO Take 10 mg by mouth daily.   fluconazole 150 MG tablet Commonly known as: DIFLUCAN Take 1 tablet (150 mg total) by mouth once for 1 dose. Started by: Felix Pacini, DO   multivitamin capsule Take 1 capsule by mouth daily.   Vitamin D (Cholecalciferol) 10 MCG (400 UNIT) Caps Take 1 tablet by mouth as needed. 2,000 daily        All past medical history, surgical history, allergies, family history, immunizations andmedications were updated in the EMR today and reviewed under the history and medication portions of their EMR.     ROS Negative, with the exception  of above mentioned in HPI   Objective:  BP 103/68    Pulse 82    Temp 98.4 F (36.9 C) (Oral)    Ht 5' 2.5" (1.588 m)    Wt 145 lb (65.8 kg)    LMP 10/22/2021 (Exact Date)    SpO2 98%    BMI 26.10 kg/m  Body mass index is 26.1 kg/m. Physical Exam Vitals and nursing note reviewed.  Constitutional:      General: She is not in acute distress.    Appearance: Normal appearance. She is normal weight. She is not ill-appearing or toxic-appearing.  HENT:     Head: Normocephalic and atraumatic.     Right Ear: Ear canal normal. No drainage, swelling or tenderness. A middle ear effusion is present. Tympanic membrane is not erythematous.     Left Ear: Tympanic membrane and ear canal normal.     Nose: Congestion present. No rhinorrhea.     Mouth/Throat:     Mouth: Mucous membranes are moist. No oral lesions.     Pharynx: Pharyngeal swelling and posterior oropharyngeal erythema present. No oropharyngeal exudate.     Tonsils: No tonsillar exudate or tonsillar abscesses.  Eyes:     Extraocular Movements: Extraocular movements intact.     Right eye: Normal extraocular motion.     Left eye: Normal extraocular motion.     Conjunctiva/sclera: Conjunctivae normal.     Pupils: Pupils are equal, round, and reactive to light.  Cardiovascular:     Rate and Rhythm: Normal rate and regular rhythm.     Heart sounds: No murmur heard. Pulmonary:     Effort: Pulmonary effort is normal. No respiratory distress.     Breath sounds: Normal breath sounds. No wheezing, rhonchi or rales.  Skin:    General: Skin is warm and dry.     Findings: No rash.  Neurological:     Mental Status: She is alert and oriented to person, place, and time. Mental status is at baseline.  Psychiatric:        Mood and Affect: Mood normal.        Behavior: Behavior normal.        Thought Content: Thought content normal.        Judgment: Judgment normal.    No results found. No results found. Results for orders placed or performed  in visit on 10/27/21 (from the past 24 hour(s))  POCT rapid strep A     Status: None   Collection Time: 10/27/21  3:45 PM  Result Value Ref Range   Rapid Strep A Screen Negative Negative  POCT Influenza A/B     Status: Normal  Collection Time: 10/27/21  4:03 PM  Result Value Ref Range   Influenza A, POC Negative Negative   Influenza B, POC Negative Negative  POC COVID-19 BinaxNow     Status: Normal   Collection Time: 10/27/21  4:04 PM  Result Value Ref Range   SARS Coronavirus 2 Ag Negative Negative    Assessment/Plan: Shari [name] is a 31 y.o. female present for OV for  Acute pharyngitis, unspecified etiology/headache Suspect early strep throat as cause of symptoms, especially when her daughter tested positive today and has similar symptoms.  Elected to treat and send for throat culture Rest, hydrate.  +/- flonase, mucinex (DM if cough), nettie pot or nasal saline.  Augmentin prescribed, take until completed.  Discussed OTC throat lozenges for comfort If cough present it can last up to 6-8 weeks.  F/U 2 weeks if not improved.  All below point-of-care testing were negative in the office today - Upper Respiratory Culture - POCT rapid strep A - POCT Influenza A/B - POC COVID-19 BinaxNow   Reviewed expectations re: course of current medical issues. Discussed self-management of symptoms. Outlined signs and symptoms indicating need for more acute intervention. Patient verbalized understanding and all questions were answered. Patient received an After-Visit Summary.    Orders Placed This Encounter  Procedures   Upper Respiratory Culture   POCT rapid strep A   POCT Influenza A/B   POC COVID-19 BinaxNow   Meds ordered this encounter  Medications   amoxicillin-clavulanate (AUGMENTIN) 875-125 MG tablet    Sig: Take 1 tablet by mouth 2 (two) times daily.    Dispense:  20 tablet    Refill:  0   fluconazole (DIFLUCAN) 150 MG tablet    Sig: Take 1 tablet (150 mg total)  by mouth once for 1 dose.    Dispense:  1 tablet    Refill:  0   Referral Orders  No referral(s) requested today     Note is dictated utilizing voice recognition software. Although note has been proof read prior to signing, occasional typographical errors still can be missed. If any questions arise, please do not hesitate to call for verification.   electronically signed by:  Felix Pacini, DO  [facility] [place]

## 2021-10-29 LAB — CULTURE, UPPER RESPIRATORY
MICRO NUMBER:: 12974959
SPECIMEN QUALITY:: ADEQUATE

## 2022-09-21 ENCOUNTER — Encounter: Admitting: Family Medicine

## 2022-09-24 ENCOUNTER — Encounter: Payer: Self-pay | Admitting: Family Medicine

## 2022-09-24 ENCOUNTER — Ambulatory Visit (INDEPENDENT_AMBULATORY_CARE_PROVIDER_SITE_OTHER): Admitting: Family Medicine

## 2022-09-24 VITALS — BP 111/71 | HR 84 | Temp 98.1°F | Ht 63.0 in | Wt 149.0 lb

## 2022-09-24 DIAGNOSIS — Z130 Encounter for screening for diseases of the blood and blood-forming organs and certain disorders involving the immune mechanism: Secondary | ICD-10-CM

## 2022-09-24 DIAGNOSIS — Z79899 Other long term (current) drug therapy: Secondary | ICD-10-CM

## 2022-09-24 DIAGNOSIS — R4582 Worries: Secondary | ICD-10-CM

## 2022-09-24 DIAGNOSIS — Z131 Encounter for screening for diabetes mellitus: Secondary | ICD-10-CM | POA: Diagnosis not present

## 2022-09-24 DIAGNOSIS — R5383 Other fatigue: Secondary | ICD-10-CM | POA: Diagnosis not present

## 2022-09-24 DIAGNOSIS — Z0000 Encounter for general adult medical examination without abnormal findings: Secondary | ICD-10-CM | POA: Diagnosis not present

## 2022-09-24 LAB — COMPREHENSIVE METABOLIC PANEL
ALT: 19 U/L (ref 0–35)
AST: 17 U/L (ref 0–37)
Albumin: 4.6 g/dL (ref 3.5–5.2)
Alkaline Phosphatase: 19 U/L — ABNORMAL LOW (ref 39–117)
BUN: 17 mg/dL (ref 6–23)
CO2: 27 mEq/L (ref 19–32)
Calcium: 9.3 mg/dL (ref 8.4–10.5)
Chloride: 104 mEq/L (ref 96–112)
Creatinine, Ser: 0.88 mg/dL (ref 0.40–1.20)
GFR: 86.05 mL/min (ref >60.00)
Glucose, Bld: 85 mg/dL (ref 70–99)
Potassium: 4.5 mEq/L (ref 3.5–5.1)
Sodium: 138 mEq/L (ref 135–145)
Total Bilirubin: 0.5 mg/dL (ref 0.2–1.2)
Total Protein: 7.4 g/dL (ref 6.0–8.3)

## 2022-09-24 LAB — LIPID PANEL
Cholesterol: 208 mg/dL — ABNORMAL HIGH (ref 0–200)
HDL: 66.5 mg/dL (ref >39.00)
LDL Cholesterol: 127 mg/dL — ABNORMAL HIGH (ref 0–99)
NonHDL: 141.97
Total CHOL/HDL Ratio: 3
Triglycerides: 77 mg/dL (ref 0.0–149.0)
VLDL: 15.4 mg/dL (ref 0.0–40.0)

## 2022-09-24 LAB — CBC
HCT: 41.1 % (ref 36.0–46.0)
Hemoglobin: 13.8 g/dL (ref 12.0–15.0)
MCHC: 33.5 g/dL (ref 30.0–36.0)
MCV: 91.1 fl (ref 78.0–100.0)
Platelets: 303 10*3/uL (ref 150.0–400.0)
RBC: 4.51 Mil/uL (ref 3.87–5.11)
RDW: 12.4 % (ref 11.5–15.5)
WBC: 6.1 10*3/uL (ref 4.0–10.5)

## 2022-09-24 LAB — IBC + FERRITIN
Ferritin: 31.1 ng/mL (ref 10.0–291.0)
Iron: 103 ug/dL (ref 42–145)
Saturation Ratios: 32.7 % (ref 20.0–50.0)
TIBC: 315 ug/dL (ref 250.0–450.0)
Transferrin: 225 mg/dL (ref 212.0–360.0)

## 2022-09-24 LAB — VITAMIN D 25 HYDROXY (VIT D DEFICIENCY, FRACTURES): VITD: 21.09 ng/mL — ABNORMAL LOW (ref 30.00–100.00)

## 2022-09-24 LAB — TSH: TSH: 1.21 u[IU]/mL (ref 0.35–5.50)

## 2022-09-24 LAB — VITAMIN B12: Vitamin B-12: 275 pg/mL (ref 211–911)

## 2022-09-24 LAB — HEMOGLOBIN A1C: Hgb A1c MFr Bld: 5.2 % (ref 4.6–6.5)

## 2022-09-24 NOTE — Progress Notes (Signed)
Patient ID: RHEANA Shari Shari [name], female  DOB: 10-14-1990, 32 y.o.   MRN: [id number] Patient Care Team    Relationship Specialty Notifications Start End  Ma Hillock, DO PCP - General Family Medicine  08/22/15   [name], MD Consulting Physician Obstetrics and Gynecology  03/01/18   Specialists, Dermatology  Dermatology  03/01/18     Chief Complaint  Patient presents with   Annual Exam    Pt is fasting     Subjective: Shari Shari [name] is a 32 y.o.  Female  present for CPE All past medical history, surgical history, allergies, family history, immunizations, medications and social history were updated in the electronic medical record today. All recent labs, ED visits and hospitalizations within the last year were reviewed.  Health maintenance:  Colonoscopy: No FHX, routine screen at 43 Mammogram: No fhx, routine screen at 68, Has OB/GYN Cervical cancer screening:2022,  Dr. Melba Shari [name] Immunizations: Flu 06/20/2022. Tdap UTD 2015 Infectious disease screening: HIV screen and hepatitis C screen completed DEXA: routine screen  Patient has a Dental home. Hospitalizations/ED visits: reviewed     09/24/2022    8:14 AM 09/17/2021    9:49 AM 03/12/2020    9:57 AM 05/24/2019    9:38 AM 03/09/2019    2:02 PM  Depression screen PHQ 2/9  Decreased Interest 0 0 0 0 0  Down, Depressed, Hopeless 0 0 0 0 0  PHQ - 2 Score 0 0 0 0 0  Altered sleeping 0   0 0  Tired, decreased energy 2   1 2   Change in appetite 0   0 0  Feeling bad or failure about yourself  0   0 0  Trouble concentrating 0   0 0  Moving slowly or fidgety/restless 0   0 0  Suicidal thoughts 0   0 0  PHQ-9 Score 2   1 2   Difficult doing work/chores    Not difficult at all Not difficult at all      09/24/2022    8:14 AM 03/12/2020    9:57 AM 05/24/2019    9:38 AM 03/09/2019    2:03 PM  GAD 7 : Generalized Anxiety Score  Nervous, Anxious, on Edge 0 0 1 2  Control/stop worrying 0 0 0 2  Worry too much - different things  0 0 0 2  Trouble relaxing 0 0 0 2  Restless 0 0 0 0  Easily annoyed or irritable 0 0 0 1  Afraid - awful might happen 0 0 0 2  Total GAD 7 Score 0 0 1 11  Anxiety Difficulty  Not difficult at all Not difficult at all Not difficult at all    Immunization History  Administered Date(s) Administered   Influenza-Unspecified 04/21/2015, 06/20/2017, 06/19/2018, 06/20/2021, 06/20/2022   MMR 04/20/2014   PFIZER(Purple Top)SARS-COV-2 Vaccination 11/16/2019, 12/07/2019   Tdap 12/19/2013, 01/29/2014   Past Medical History:  Diagnosis Date   Anemia    after delivery   Anxiety    Back skin lesion 09/05/2015   Cholestasis during pregnancy    Diastasis recti 03/01/2018   Hemorrhagic cyst of ovary 07/10/2018   Lesion of vulva 06/05/2018   Medical history non-contributory    Normal pregnancy 04/18/2014   SVD (spontaneous vaginal delivery) 04/19/2014   SVD (spontaneous vaginal delivery) 12/28/2016   No Known Allergies Past Surgical History:  Procedure Laterality Date   BREAST SURGERY Left    cyst aspiration   WISDOM TOOTH EXTRACTION  Family History  Problem Relation Age of Onset   Diabetes Mother    Asthma Mother    Hypertension Mother    Heart disease Father    Arthritis Father    Depression Father    Hyperlipidemia Father    Hypertension Father    Hashimoto's thyroiditis Sister    Heart disease Maternal Grandfather    Hypertension Maternal Grandfather    Hyperlipidemia Maternal Grandmother    Asthma Son    Diabetes Paternal Aunt    Heart disease Maternal Uncle    Social History   Social History Narrative   Married. Husband's names Darnelle Shari Shari [name]. One child (female) Shari Shari [name].   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 Takes a daily vitamin.   Wears her seatbelt. Smoke detector located in the home. Firearms located in the home and a locked.   Feels safe in her relationships.     Allergies as of 09/24/2022   No Known Allergies      Medication  List        Accurate as of September 24, 2022  8:26 AM. If you have any questions, ask your nurse or doctor.          STOP taking these medications    amoxicillin-clavulanate 875-125 MG tablet Commonly known as: AUGMENTIN Stopped by: [name], DO   multivitamin capsule Stopped by: [name], DO   Vitamin D (Cholecalciferol) 10 MCG (400 UNIT) Caps Stopped by: [name], DO       TAKE these medications    CULTURELLE PROBIOTICS PO Take by mouth.   escitalopram 10 MG tablet Commonly known as: LEXAPRO Take 10 mg by mouth daily.        All past medical history, surgical history, allergies, family history, immunizations andmedications were updated in the EMR today and reviewed under the history and medication portions of their EMR.     No results found for this or any previous visit (from the past 2160 hour(s)).  No results found.  ROS 14 pt review of systems performed and negative (unless mentioned in an HPI)  Objective: BP 111/71   Pulse 84   Temp 98.1 F (36.7 C) (Oral)   Ht 5\' 3"  (1.6 m)   Wt 149 lb (67.6 kg)   SpO2 100%   BMI 26.39 kg/m  Physical Exam Vitals and nursing note reviewed.  Constitutional:      General: She is not in acute distress.    Appearance: Normal appearance. She is not ill-appearing or toxic-appearing.  HENT:     Head: Normocephalic and atraumatic.     Right Ear: Tympanic membrane, ear canal and external ear normal. There is no impacted cerumen.     Left Ear: Tympanic membrane, ear canal and external ear normal. There is no impacted cerumen.     Nose: No congestion or rhinorrhea.     Mouth/Throat:     Mouth: Mucous membranes are moist.     Pharynx: Oropharynx is clear. No oropharyngeal exudate or posterior oropharyngeal erythema.  Eyes:     General: No scleral icterus.       Right eye: No discharge.        Left eye: No discharge.     Extraocular Movements: Extraocular movements intact.     Conjunctiva/sclera:  Conjunctivae normal.     Pupils: Pupils are equal, round, and reactive to light.  Cardiovascular:     Rate and Rhythm: Normal rate and regular rhythm.  Pulses: Normal pulses.     Heart sounds: Normal heart sounds. No murmur heard.    No friction rub. No gallop.  Pulmonary:     Effort: Pulmonary effort is normal. No respiratory distress.     Breath sounds: Normal breath sounds. No stridor. No wheezing, rhonchi or rales.  Chest:     Chest wall: No tenderness.  Abdominal:     General: Abdomen is flat. Bowel sounds are normal. There is no distension.     Palpations: Abdomen is soft. There is no mass.     Tenderness: There is no abdominal tenderness. There is no right CVA tenderness, left CVA tenderness, guarding or rebound.     Hernia: No hernia is present.  Musculoskeletal:        General: No swelling, tenderness or deformity. Normal range of motion.     Cervical back: Normal range of motion and neck supple. No rigidity or tenderness.     Right lower leg: No edema.     Left lower leg: No edema.  Lymphadenopathy:     Cervical: No cervical adenopathy.  Skin:    General: Skin is warm and dry.     Coloration: Skin is not jaundiced or pale.     Findings: No bruising, erythema, lesion or rash.  Neurological:     General: No focal deficit present.     Mental Status: She is alert and oriented to person, place, and time. Mental status is at baseline.     Cranial Nerves: No cranial nerve deficit.     Sensory: No sensory deficit.     Motor: No weakness.     Coordination: Coordination normal.     Gait: Gait normal.     Deep Tendon Reflexes: Reflexes normal.  Psychiatric:        Mood and Affect: Mood normal.        Behavior: Behavior normal.        Thought Content: Thought content normal.        Judgment: Judgment normal.      No results found.  Assessment/plan: SMT. LODER is a 32 y.o. female present for CPE Worries/PMDD - has been on lexapro 10 mg qd> now by  gyn -  TSH Encounter for long-term current use of medication - Comprehensive metabolic panel - Hemoglobin A1c - Lipid panel - TSH - CBC  Fatigue: New compliant, not associated with physical Cycles are changed also w/ migraine - b12, vit d, iron panel  Screening for deficiency anemia - CBC Diabetes mellitus screening - Comprehensive metabolic panel - Hemoglobin A1c  Routine general medical examination at a health care facility Colonoscopy: No FHX, routine screen at 43 Mammogram: No fhx, routine screen at 40, Has OB/GYN Cervical cancer screening:2022,  Dr. Ellyn Hack Immunizations: Flu 06/20/2022. Tdap UTD 2015 Infectious disease screening: HIV screen and hepatitis C screen completed DEXA: routine screen  Patient was encouraged to exercise greater than 150 minutes a week. Patient was encouraged to choose a diet filled with fresh fruits and vegetables, and lean meats. AVS provided to patient today for education/recommendation on gender specific health and safety maintenance. - Comprehensive metabolic panel - Hemoglobin A1c - Lipid panel - TSH - CBC  Return in about 1 year (around 09/26/2023) for cpe (20 min).  Orders Placed This Encounter  Procedures   Comprehensive metabolic panel   Hemoglobin A1c   Lipid panel   TSH   CBC   Vitamin D (25 hydroxy)   B12   IBC +  Ferritin   No orders of the defined types were placed in this encounter.  Referral Orders  No referral(s) requested today    Electronically signed by: Felix Pacini, DO [facility]

## 2022-09-24 NOTE — Patient Instructions (Addendum)

## 2022-09-27 ENCOUNTER — Telehealth: Payer: Self-pay | Admitting: Family Medicine

## 2022-09-27 DIAGNOSIS — E559 Vitamin D deficiency, unspecified: Secondary | ICD-10-CM

## 2022-09-27 MED ORDER — VITAMIN D (ERGOCALCIFEROL) 1.25 MG (50000 UNIT) PO CAPS: 50000.0000 [IU] | ORAL_CAPSULE | ORAL | 0 refills | Status: DC

## 2022-09-27 NOTE — Telephone Encounter (Signed)
Please inform patient: Her liver, kidney function and thyroid function are normal. Her cholesterol panel is at goal and about the same as last year. Her iron panel is normal blood cell counts are normal Diabetes screening is normal Her vitamin D is significantly low at 21.  I have called in once weekly high-dose vitamin D to be taken with food for 12 weeks.  She should then continue on over-the-counter vitamin D supplementation of 1000 units. Her B12 is low at 275.  I would recommend she start sublingual B12 1000 mcg daily.  Since her B12 is low, I suspect she does not absorb B12 through the GI system, therefore it needs to be the type of B12 that is placed under the tongue for best absorption.

## 2022-09-28 NOTE — Telephone Encounter (Signed)
Spoke with pt regarding labs and instructions.   

## 2023-09-27 ENCOUNTER — Ambulatory Visit (INDEPENDENT_AMBULATORY_CARE_PROVIDER_SITE_OTHER): Payer: 59 | Admitting: Family Medicine

## 2023-09-27 ENCOUNTER — Encounter: Payer: Self-pay | Admitting: Family Medicine

## 2023-09-27 VITALS — BP 130/82 | HR 73 | Temp 98.1°F | Ht 63.0 in | Wt 158.2 lb

## 2023-09-27 DIAGNOSIS — Z23 Encounter for immunization: Secondary | ICD-10-CM | POA: Diagnosis not present

## 2023-09-27 DIAGNOSIS — E538 Deficiency of other specified B group vitamins: Secondary | ICD-10-CM | POA: Diagnosis not present

## 2023-09-27 DIAGNOSIS — E559 Vitamin D deficiency, unspecified: Secondary | ICD-10-CM

## 2023-09-27 DIAGNOSIS — Z131 Encounter for screening for diabetes mellitus: Secondary | ICD-10-CM

## 2023-09-27 DIAGNOSIS — Z0000 Encounter for general adult medical examination without abnormal findings: Secondary | ICD-10-CM | POA: Diagnosis not present

## 2023-09-27 DIAGNOSIS — Z13220 Encounter for screening for lipoid disorders: Secondary | ICD-10-CM | POA: Diagnosis not present

## 2023-09-27 DIAGNOSIS — R4582 Worries: Secondary | ICD-10-CM

## 2023-09-27 LAB — COMPREHENSIVE METABOLIC PANEL
ALT: 35 U/L (ref 0–35)
AST: 25 U/L (ref 0–37)
Albumin: 4.5 g/dL (ref 3.5–5.2)
Alkaline Phosphatase: 22 U/L — ABNORMAL LOW (ref 39–117)
BUN: 13 mg/dL (ref 6–23)
CO2: 29 meq/L (ref 19–32)
Calcium: 9.1 mg/dL (ref 8.4–10.5)
Chloride: 103 meq/L (ref 96–112)
Creatinine, Ser: 0.8 mg/dL (ref 0.40–1.20)
GFR: 95.79 mL/min (ref >60.00)
Glucose, Bld: 92 mg/dL (ref 70–99)
Potassium: 4.2 meq/L (ref 3.5–5.1)
Sodium: 138 meq/L (ref 135–145)
Total Bilirubin: 0.5 mg/dL (ref 0.2–1.2)
Total Protein: 7.1 g/dL (ref 6.0–8.3)

## 2023-09-27 LAB — LIPID PANEL
Cholesterol: 200 mg/dL (ref 0–200)
HDL: 62.4 mg/dL (ref >39.00)
LDL Cholesterol: 124 mg/dL — ABNORMAL HIGH (ref 0–99)
NonHDL: 137.49
Total CHOL/HDL Ratio: 3
Triglycerides: 65 mg/dL (ref 0.0–149.0)
VLDL: 13 mg/dL (ref 0.0–40.0)

## 2023-09-27 LAB — CBC
HCT: 38.3 % (ref 36.0–46.0)
Hemoglobin: 13.2 g/dL (ref 12.0–15.0)
MCHC: 34.5 g/dL (ref 30.0–36.0)
MCV: 92.3 fL (ref 78.0–100.0)
Platelets: 302 10*3/uL (ref 150.0–400.0)
RBC: 4.15 Mil/uL (ref 3.87–5.11)
RDW: 12.7 % (ref 11.5–15.5)
WBC: 5.9 10*3/uL (ref 4.0–10.5)

## 2023-09-27 LAB — TSH: TSH: 1.1 u[IU]/mL (ref 0.35–5.50)

## 2023-09-27 LAB — VITAMIN D 25 HYDROXY (VIT D DEFICIENCY, FRACTURES): VITD: 25.4 ng/mL — ABNORMAL LOW (ref 30.00–100.00)

## 2023-09-27 LAB — B12 AND FOLATE PANEL
Folate: 7.7 ng/mL (ref >5.9)
Vitamin B-12: 592 pg/mL (ref 211–911)

## 2023-09-27 LAB — HEMOGLOBIN A1C: Hgb A1c MFr Bld: 5.2 % (ref 4.6–6.5)

## 2023-09-27 NOTE — Patient Instructions (Addendum)

## 2023-09-27 NOTE — Progress Notes (Signed)
 Patient ID: Shari [name], female  DOB: Nov 30, 1990, 33 y.o.   MRN: [id number] Patient Care Team    Relationship Specialty Notifications Start End  Catherine Charlies LABOR, DO PCP - General Family Medicine  08/22/15   Danielle Rom, MD Consulting Physician Obstetrics and Gynecology  03/01/18   Specialists, Dermatology  Dermatology  03/01/18     Chief Complaint  Patient presents with   Annual Exam    Pt is fasting    Subjective: Shari [name] is a 33 y.o.  Female  present for CPE All past medical history, surgical history, allergies, family history, immunizations, medications and social history were updated in the electronic medical record today. All recent labs, ED visits and hospitalizations within the last year were reviewed.  Health maintenance:  Colon cancer screen: No FHX, routine screen at 43 Breast cancer screen: No fhx, routine screen at 40, Has OB/GYN Cervical cancer screening:2022,  Dr. Rolan Immunizations: Influenza vaccine UTD 2024, Tdap -declined today, she can make a nurse visit if desired  Infectious disease screening: HIV screen and hepatitis C screen completed DEXA: routine screen  Patient has a Dental home. Hospitalizations/ED visits: reviewed  Last year on CPE patient was found to be vitamin D  and B12 deficient.Taking a  MV that has both.      09/24/2022    8:14 AM 09/17/2021    9:49 AM 03/12/2020    9:57 AM 05/24/2019    9:38 AM 03/09/2019    2:02 PM  Depression screen PHQ 2/9  Decreased Interest 0 0 0 0 0  Down, Depressed, Hopeless 0 0 0 0 0  PHQ - 2 Score 0 0 0 0 0  Altered sleeping 0   0 0  Tired, decreased energy 2   1 2   Change in appetite 0   0 0  Feeling bad or failure about yourself  0   0 0  Trouble concentrating 0   0 0  Moving slowly or fidgety/restless 0   0 0  Suicidal thoughts 0   0 0  PHQ-9 Score 2   1 2   Difficult doing work/chores    Not difficult at all Not difficult at all      09/24/2022    8:14 AM 03/12/2020    9:57 AM  05/24/2019    9:38 AM 03/09/2019    2:03 PM  GAD 7 : Generalized Anxiety Score  Nervous, Anxious, on Edge 0 0 1 2  Control/stop worrying 0 0 0 2  Worry too much - different things 0 0 0 2  Trouble relaxing 0 0 0 2  Restless 0 0 0 0  Easily annoyed or irritable 0 0 0 1  Afraid - awful might happen 0 0 0 2  Total GAD 7 Score 0 0 1 11  Anxiety Difficulty  Not difficult at all Not difficult at all Not difficult at all    Immunization History  Administered Date(s) Administered   Influenza-Unspecified 04/21/2015, 06/20/2017, 06/19/2018, 06/20/2021, 06/20/2022, 07/19/2023   MMR 04/20/2014   PFIZER(Purple Top)SARS-COV-2 Vaccination 11/16/2019, 12/07/2019   Tdap 12/19/2013, 01/29/2014   Past Medical History:  Diagnosis Date   Anemia    after delivery   Anxiety    Back skin lesion 09/05/2015   Cholestasis during pregnancy    Diastasis recti 03/01/2018   Hemorrhagic cyst of ovary 07/10/2018   Lesion of vulva 06/05/2018   Medical history non-contributory    Normal pregnancy 04/18/2014   SVD (spontaneous vaginal delivery)  04/19/2014   SVD (spontaneous vaginal delivery) 12/28/2016   No Known Allergies Past Surgical History:  Procedure Laterality Date   BREAST SURGERY Left    cyst aspiration   WISDOM TOOTH EXTRACTION     Family History  Problem Relation Age of Onset   Diabetes Mother    Asthma Mother    Hypertension Mother    Heart disease Father    Arthritis Father    Depression Father    Hyperlipidemia Father    Hypertension Father    Hashimoto's thyroiditis Sister    Hyperlipidemia Maternal Grandmother    Heart disease Maternal Grandfather    Hypertension Maternal Grandfather    Asthma Son    Heart disease Maternal Uncle    Stroke Maternal Uncle 97       passed away   Diabetes Paternal Aunt    Social History   Social History Narrative   Married. Husband's names Caron. One child (female) Santana Bradley.   BSN, [employer]. Works for [employer].    Drinks caffeinated  beverages. Takes a daily vitamin.   Wears her seatbelt. Smoke detector located in the home. Firearms located in the home and a locked.   Feels safe in her relationships.     Allergies as of 09/27/2023   No Known Allergies      Medication List        Accurate as of September 27, 2023  9:05 AM. If you have any questions, ask your nurse or doctor.          STOP taking these medications    CULTURELLE PROBIOTICS PO Stopped by: Charlies Bellini   Vitamin D  (Ergocalciferol ) 1.25 MG (50000 UNIT) Caps capsule Commonly known as: DRISDOL  Stopped by: Charlies Bellini       TAKE these medications    escitalopram  10 MG tablet Commonly known as: LEXAPRO  Take 10 mg by mouth daily.        All past medical history, surgical history, allergies, family history, immunizations andmedications were updated in the EMR today and reviewed under the history and medication portions of their EMR.     No results found for this or any previous visit (from the past 2160 hours).  No results found.  ROS 14 pt review of systems performed and negative (unless mentioned in an HPI)  Objective: BP 130/82   Pulse 73   Temp 98.1 F (36.7 C)   Ht 5' 3 (1.6 m)   Wt 158 lb 3.2 oz (71.8 kg)   LMP 09/16/2023   SpO2 97%   BMI 28.02 kg/m  Physical Exam Vitals and nursing note reviewed.  Constitutional:      General: She is not in acute distress.    Appearance: Normal appearance. She is not ill-appearing or toxic-appearing.  HENT:     Head: Normocephalic and atraumatic.     Right Ear: Tympanic membrane, ear canal and external ear normal. There is no impacted cerumen.     Left Ear: Tympanic membrane, ear canal and external ear normal. There is no impacted cerumen.     Nose: No congestion or rhinorrhea.     Mouth/Throat:     Mouth: Mucous membranes are moist.     Pharynx: Oropharynx is clear. No oropharyngeal exudate or posterior oropharyngeal erythema.  Eyes:     General: No scleral icterus.        Right eye: No discharge.        Left eye: No discharge.     Extraocular Movements: Extraocular movements  intact.     Conjunctiva/sclera: Conjunctivae normal.     Pupils: Pupils are equal, round, and reactive to light.  Cardiovascular:     Rate and Rhythm: Normal rate and regular rhythm.     Pulses: Normal pulses.     Heart sounds: Normal heart sounds. No murmur heard.    No friction rub. No gallop.  Pulmonary:     Effort: Pulmonary effort is normal. No respiratory distress.     Breath sounds: Normal breath sounds. No stridor. No wheezing, rhonchi or rales.  Chest:     Chest wall: No tenderness.  Abdominal:     General: Abdomen is flat. Bowel sounds are normal. There is no distension.     Palpations: Abdomen is soft. There is no mass.     Tenderness: There is no abdominal tenderness. There is no right CVA tenderness, left CVA tenderness, guarding or rebound.     Hernia: No hernia is present.  Musculoskeletal:        General: No swelling, tenderness or deformity. Normal range of motion.     Cervical back: Normal range of motion and neck supple. No rigidity or tenderness.     Right lower leg: No edema.     Left lower leg: No edema.  Lymphadenopathy:     Cervical: No cervical adenopathy.  Skin:    General: Skin is warm and dry.     Coloration: Skin is not jaundiced or pale.     Findings: No bruising, erythema, lesion or rash.  Neurological:     General: No focal deficit present.     Mental Status: She is alert and oriented to person, place, and time. Mental status is at baseline.     Cranial Nerves: No cranial nerve deficit.     Sensory: No sensory deficit.     Motor: No weakness.     Coordination: Coordination normal.     Gait: Gait normal.     Deep Tendon Reflexes: Reflexes normal.  Psychiatric:        Mood and Affect: Mood normal.        Behavior: Behavior normal.        Thought Content: Thought content normal.        Judgment: Judgment normal.      No results  found.  Assessment/plan: Shari [name] is a 33 y.o. female present for CPE and Chronic Conditions/illness Management Routine general medical examination at a health care facility Patient was encouraged to exercise greater than 150 minutes a week. Patient was encouraged to choose a diet filled with fresh fruits and vegetables, and lean meats. AVS provided to patient today for education/recommendation on gender specific health and safety maintenance. Colonoscopy: No FHX, routine screen at 43 Mammogram: No fhx, routine screen at 40, Has OB/GYN Cervical cancer screening:2022,  Dr. Rolan Immunizations: Influenza vaccine UTD 2024, Tdap -declined today, she can make a nurse visit if desired  Infectious disease screening: HIV screen and hepatitis C screen completed DEXA: routine screen  Patient has a Dental home. Hospitalizations/ED visits: reviewed - CBC - Comprehensive metabolic panel - TSH  Vitamin D  deficiency - Vitamin D  (25 hydroxy) Diabetes mellitus screening - Hemoglobin A1c Lipid screening - Lipid panel B12 deficiency - B12 and Folate Panel  Return in about 1 year (around 09/27/2024) for cpe (20 min), Routine chronic condition follow-up.  Orders Placed This Encounter  Procedures   CBC   Comprehensive metabolic panel   Hemoglobin A1c   TSH   Lipid panel  Vitamin D  (25 hydroxy)   B12 and Folate Panel   No orders of the defined types were placed in this encounter.  Referral Orders  No referral(s) requested today    Electronically signed by: Charlies Bellini, DO [facility]

## 2023-10-02 ENCOUNTER — Encounter: Payer: Self-pay | Admitting: Family Medicine

## 2024-09-21 ENCOUNTER — Telehealth: Admitting: Family Medicine

## 2024-09-21 DIAGNOSIS — J329 Chronic sinusitis, unspecified: Secondary | ICD-10-CM | POA: Diagnosis not present

## 2024-09-21 DIAGNOSIS — B9689 Other specified bacterial agents as the cause of diseases classified elsewhere: Secondary | ICD-10-CM

## 2024-09-21 MED ORDER — DOXYCYCLINE HYCLATE 100 MG PO TABS: 100.0000 mg | ORAL_TABLET | Freq: Two times a day (BID) | ORAL | 0 refills | Status: AC

## 2024-09-21 NOTE — Patient Instructions (Signed)

## 2024-09-21 NOTE — Progress Notes (Signed)
 "      Shari [name] , Aug 12, 1991, 34 y.o., female MRN: [id number] Patient Care Team    Relationship Specialty Notifications Start End  Catherine Charlies LABOR, DO PCP - General Family Medicine  08/22/15   Danielle Rom, MD Consulting Physician Obstetrics and Gynecology  03/01/18   Specialists, Dermatology  Dermatology  03/01/18     Chief Complaint  Patient presents with   Nasal Congestion    1 week; sinus congestion, headache, mild cough, r ear pain. Pt has tried ibuprofen .      Subjective: Shari [name] is a 34 y.o. Pt presents for an OV with complaints of sinus congestion, sinus pain of greater than 1 week headache, cough and right ear pain duration.  She reports she was around sick contacts around Christmas and has had sinus symptoms since that are not improving. Pt has tried ibuprofen  to ease their symptoms.      09/27/2023    2:57 PM 09/24/2022    8:14 AM 09/17/2021    9:49 AM 03/12/2020    9:57 AM 05/24/2019    9:38 AM  Depression screen PHQ 2/9  Decreased Interest 0 0 0 0 0  Down, Depressed, Hopeless 0 0 0 0 0  PHQ - 2 Score 0 0 0 0 0  Altered sleeping  0   0  Tired, decreased energy  2   1  Change in appetite  0   0  Feeling bad or failure about yourself   0   0  Trouble concentrating  0   0  Moving slowly or fidgety/restless  0   0  Suicidal thoughts  0   0  PHQ-9 Score  2    1   Difficult doing work/chores     Not difficult at all     Data saved with a previous flowsheet row definition    Allergies[1] Social History   Social History Narrative   Married. Husband's names Caron. One child (female) Santana Bradley.   BSN, [employer]. Works for [employer].    Drinks caffeinated beverages. Takes a daily vitamin.   Wears her seatbelt. Smoke detector located in the home. Firearms located in the home and a locked.   Feels safe in her relationships.    Past Medical History:  Diagnosis Date   Anemia    after delivery   Anxiety    Back skin lesion 09/05/2015    Cholestasis during pregnancy    Diastasis recti 03/01/2018   Hemorrhagic cyst of ovary 07/10/2018   Lesion of vulva 06/05/2018   Medical history non-contributory    Normal pregnancy 04/18/2014   SVD (spontaneous vaginal delivery) 04/19/2014   SVD (spontaneous vaginal delivery) 12/28/2016   Past Surgical History:  Procedure Laterality Date   BREAST SURGERY Left    cyst aspiration   WISDOM TOOTH EXTRACTION     Family History  Problem Relation Age of Onset   Diabetes Mother    Asthma Mother    Hypertension Mother    Heart disease Father    Arthritis Father    Depression Father    Hyperlipidemia Father    Hypertension Father    Hashimoto's thyroiditis Sister    Hyperlipidemia Maternal Grandmother    Heart disease Maternal Grandfather    Hypertension Maternal Grandfather    Asthma Son    Heart disease Maternal Uncle    Stroke Maternal Uncle 70       passed away   Diabetes Paternal Aunt  Allergies as of 09/21/2024   No Known Allergies      Medication List        Accurate as of September 21, 2024 11:46 AM. If you have any questions, ask your nurse or doctor.          doxycycline 100 MG tablet Commonly known as: VIBRA-TABS Take 1 tablet (100 mg total) by mouth 2 (two) times daily. Started by: Charlies Bellini, DO   escitalopram  10 MG tablet Commonly known as: LEXAPRO  Take 10 mg by mouth daily.        All past medical history, surgical history, allergies, family history, immunizations andmedications were updated in the EMR today and reviewed under the history and medication portions of their EMR.     Review of Systems  Constitutional:  Positive for malaise/fatigue. Negative for chills and fever.  HENT:  Positive for congestion, ear pain and sinus pain.   Eyes: Negative.   Respiratory:  Positive for cough. Negative for sputum production, shortness of breath and wheezing.   Cardiovascular: Negative.   Gastrointestinal: Negative.   Genitourinary: Negative.    Musculoskeletal:  Positive for myalgias.  Skin:  Negative for rash.  Neurological:  Positive for headaches.   Negative, with the exception of above mentioned in HPI   Objective:  There were no vitals taken for this visit. There is no height or weight on file to calculate BMI. Physical Exam Vitals and nursing note reviewed.  Constitutional:      General: She is not in acute distress.    Appearance: Normal appearance. She is ill-appearing. She is not toxic-appearing.  HENT:     Head: Normocephalic and atraumatic.  Eyes:     General: No scleral icterus.       Right eye: No discharge.        Left eye: No discharge.     Conjunctiva/sclera: Conjunctivae normal.  Pulmonary:     Effort: Pulmonary effort is normal.  Musculoskeletal:     Cervical back: Normal range of motion.  Skin:    Findings: No rash.  Neurological:     Mental Status: She is alert and oriented to person, place, and time. Mental status is at baseline.  Psychiatric:        Mood and Affect: Mood normal.        Behavior: Behavior normal.        Thought Content: Thought content normal.        Judgment: Judgment normal.      No results found. No results found. No results found for this or any previous visit (from the past 24 hours).  Assessment/Plan: Shari [name] is a 34 y.o. female present for OV for  Bacterial sinusitis Rest.  Hydrate. Nasal saline flushes twice daily followed by Flonase  nasal spray. Doxycycline twice daily prescribed. Follow-up in 2 weeks if symptoms not improving, sooner if worsening.  Reviewed expectations re: course of current medical issues. Discussed self-management of symptoms. Outlined signs and symptoms indicating need for more acute intervention. Patient verbalized understanding and all questions were answered. Patient received an After-Visit Summary.    No orders of the defined types were placed in this encounter.  Meds ordered this encounter  Medications    doxycycline (VIBRA-TABS) 100 MG tablet    Sig: Take 1 tablet (100 mg total) by mouth 2 (two) times daily.    Dispense:  20 tablet    Refill:  0   Referral Orders  No referral(s) requested today  Note is dictated utilizing voice recognition software. Although note has been proof read prior to signing, occasional typographical errors still can be missed. If any questions arise, please do not hesitate to call for verification.   electronically signed by:  Charlies Bellini, DO  [facility] [place]       [1] No Known Allergies  "
